# Patient Record
Sex: Female | Born: 1955 | Race: Black or African American | Hispanic: No | Marital: Married | Smoking: Former smoker
Health system: Southern US, Community
[De-identification: ages and names within clinical notes are randomized; demographics above are authoritative.]

## PROBLEM LIST (undated history)

## (undated) DIAGNOSIS — I10 Essential (primary) hypertension: Secondary | ICD-10-CM

## (undated) DIAGNOSIS — E119 Type 2 diabetes mellitus without complications: Secondary | ICD-10-CM

## (undated) HISTORY — PX: CARPAL TUNNEL RELEASE: SHX101

---

## 2016-07-28 DIAGNOSIS — E782 Mixed hyperlipidemia: Secondary | ICD-10-CM | POA: Insufficient documentation

## 2017-09-07 DIAGNOSIS — Z603 Acculturation difficulty: Secondary | ICD-10-CM | POA: Insufficient documentation

## 2018-09-19 DIAGNOSIS — E1142 Type 2 diabetes mellitus with diabetic polyneuropathy: Secondary | ICD-10-CM | POA: Insufficient documentation

## 2018-09-22 DIAGNOSIS — G8929 Other chronic pain: Secondary | ICD-10-CM | POA: Insufficient documentation

## 2019-10-21 ENCOUNTER — Emergency Department (HOSPITAL_BASED_OUTPATIENT_CLINIC_OR_DEPARTMENT_OTHER): Payer: Self-pay

## 2019-10-21 ENCOUNTER — Emergency Department (HOSPITAL_BASED_OUTPATIENT_CLINIC_OR_DEPARTMENT_OTHER)
Admission: EM | Admit: 2019-10-21 | Discharge: 2019-10-21 | Disposition: A | Discharge status: Home / Self Care | Payer: Self-pay | Attending: Emergency Medicine | Admitting: Emergency Medicine

## 2019-10-21 ENCOUNTER — Other Ambulatory Visit: Payer: Self-pay

## 2019-10-21 ENCOUNTER — Encounter (HOSPITAL_BASED_OUTPATIENT_CLINIC_OR_DEPARTMENT_OTHER): Payer: Self-pay | Admitting: Emergency Medicine

## 2019-10-21 DIAGNOSIS — M25531 Pain in right wrist: Secondary | ICD-10-CM | POA: Insufficient documentation

## 2019-10-21 DIAGNOSIS — E119 Type 2 diabetes mellitus without complications: Secondary | ICD-10-CM | POA: Insufficient documentation

## 2019-10-21 DIAGNOSIS — I10 Essential (primary) hypertension: Secondary | ICD-10-CM | POA: Insufficient documentation

## 2019-10-21 HISTORY — DX: Type 2 diabetes mellitus without complications: E11.9

## 2019-10-21 HISTORY — DX: Essential (primary) hypertension: I10

## 2019-10-21 MED ORDER — OXYCODONE-ACETAMINOPHEN 5-325 MG PO TABS
1.0000 | ORAL_TABLET | Freq: Once | ORAL | Status: AC
  Administered 2019-10-21: 1 via ORAL
  Filled 2019-10-21: qty 1

## 2019-10-21 MED ORDER — OXYCODONE-ACETAMINOPHEN 5-325 MG PO TABS: 1.0000 | ORAL_TABLET | Freq: Three times a day (TID) | ORAL | 0 refills | Status: AC | PRN

## 2019-10-21 MED ORDER — NAPROXEN 500 MG PO TABS: 500.0000 mg | ORAL_TABLET | Freq: Two times a day (BID) | ORAL | 0 refills | Status: DC

## 2019-10-21 NOTE — ED Notes (Signed)
Portable Xray @ bedside

## 2019-10-21 NOTE — ED Triage Notes (Signed)
Pt c/o right wrist/hand pain x 1 month. Pt denies recent injury.

## 2019-10-21 NOTE — Discharge Instructions (Signed)
Regular taking the prescribed anti-inflammatory as well as the Percocet for pain control.  Please return to ER for reassessment if you develop any redness, swelling.  Please schedule follow-up appointment with the hand specialist regarding the pain that you are experiencing.  Recommend wearing brace for your wrist as discussed.  Recommend resting, icing and elevating when possible.

## 2019-10-21 NOTE — ED Notes (Signed)
ED Provider at bedside with video interpreter to review d/c instructions

## 2019-10-21 NOTE — ED Notes (Signed)
ED Provider at bedside. 

## 2019-10-22 NOTE — ED Provider Notes (Signed)
[facility] EMERGENCY DEPARTMENT Provider Note   CSN: [id number] Arrival date & time: 10/21/19  1534     History Chief Complaint  Patient presents with  . Hand Pain  . Wrist Pain    Lindsey Hart is a 63 y.o. female.  Presents emerged from chief complaint right hand pain, wrist pain.  Has suffered from pain for the last couple years, has worsened over the past couple months.  Reports that she has seen someone about this previously but not in the past few weeks.  States at work she does a lot of repetitive hand movements, worsened with certain movements.  No significant swelling, no erythema, no recent injuries.  Has tried applying a topical gel, uncertain which this is with minimal relief.  History obtained from daughter, patient via translator, patient [place] only speaking.  HPI     Past Medical History:  Diagnosis Date  . Diabetes mellitus without complication ([place])   . Hypertension     There are no problems to display for this patient.   History reviewed. No pertinent surgical history.   OB History   No obstetric history on file.     No family history on file.  Social History   Tobacco Use  . Smoking status: Never Smoker  . Smokeless tobacco: Never Used  Substance Use Topics  . Alcohol use: Yes    Comment: occ  . Drug use: Never    Home Medications Prior to Admission medications   Medication Sig Start Date End Date Taking? Authorizing Provider  naproxen (NAPROSYN) 500 MG tablet Take 1 tablet (500 mg total) by mouth 2 (two) times daily. 10/21/19   Lucrezia Starch, MD  oxyCODONE-acetaminophen (PERCOCET) 5-325 MG tablet Take 1 tablet by mouth every 8 (eight) hours as needed for up to 3 days for severe pain. 10/21/19 10/24/19  Lucrezia Starch, MD    Allergies    Patient has no known allergies.  Review of Systems   Review of Systems  Constitutional: Negative for chills and fever.  HENT: Negative for ear pain and sore throat.     Eyes: Negative for pain and visual disturbance.  Respiratory: Negative for cough and shortness of breath.   Cardiovascular: Negative for chest pain and palpitations.  Gastrointestinal: Negative for abdominal pain and vomiting.  Genitourinary: Negative for dysuria and hematuria.  Musculoskeletal: Positive for arthralgias. Negative for back pain.  Skin: Negative for color change and rash.  Neurological: Negative for seizures and syncope.  All other systems reviewed and are negative.   Physical Exam Updated Vital Signs BP (!) 174/77 (BP Location: Left Arm)   Pulse 68   Temp 98.9 F (37.2 C)   Resp 18   Ht 5\' 5"  (1.651 m)   Wt 68 kg   SpO2 100%   BMI 24.96 kg/m   Physical Exam Vitals and nursing note reviewed.  Constitutional:      General: She is not in acute distress.    Appearance: She is well-developed.  HENT:     Head: Normocephalic and atraumatic.  Eyes:     Conjunctiva/sclera: Conjunctivae normal.  Cardiovascular:     Rate and Rhythm: Normal rate and regular rhythm.     Heart sounds: No murmur.  Pulmonary:     Effort: Pulmonary effort is normal. No respiratory distress.     Breath sounds: Normal breath sounds.  Abdominal:     Palpations: Abdomen is soft.     Tenderness: There is no abdominal  tenderness.  Musculoskeletal:     Cervical back: Neck supple.     Comments: RUE: There is some tenderness over wrist, proximal hand, no obvious deformity, normal range of motion of wrist, no erythema, no swelling, no ecchymosis, distal sensation intact, normal radial pulse  Skin:    General: Skin is warm and dry.  Neurological:     Mental Status: She is alert.     ED Results / Procedures / Treatments   Labs (all labs ordered are listed, but only abnormal results are displayed) Labs Reviewed - No data to display  EKG None  Radiology DG Forearm Right  Result Date: 10/21/2019 CLINICAL DATA:  Right forearm pain, no known injury, initial encounter EXAM: RIGHT  FOREARM - 2 VIEW COMPARISON:  None. FINDINGS: Mild degenerative changes of the radiocarpal joint are seen. No acute fracture or dislocation is noted. No soft tissue abnormality is noted. IMPRESSION: No acute abnormality noted. Electronically Signed   By: Alcide Clever M.D.   On: 10/21/2019 17:45   DG Hand Complete Right  Result Date: 10/21/2019 CLINICAL DATA:  Hand pain with repetitive work motions, no known injury. EXAM: RIGHT HAND - COMPLETE 3+ VIEW COMPARISON:  None. FINDINGS: No acute fracture or dislocation is noted. Mild interphalangeal degenerative changes noted. No soft tissue abnormality is seen. IMPRESSION: Mild degenerative change without acute abnormality. Electronically Signed   By: Alcide Clever M.D.   On: 10/21/2019 17:44    Procedures Procedures (including critical care time)  Medications Ordered in ED Medications  oxyCODONE-acetaminophen (PERCOCET/ROXICET) 5-325 MG per tablet 1 tablet (1 tablet Oral Given 10/21/19 1716)    ED Course  I have reviewed the triage vital signs and the nursing notes.  Pertinent labs & imaging results that were available during my care of the patient were reviewed by me and considered in my medical decision making (see chart for details).    MDM Rules/Calculators/A&P                      63 year old lady presents to ER with acute on chronic right wrist pain.  On exam no obvious deformity was noted, did have some tenderness over her wrist.  X-rays negative.  Based on symptomatology, suspect arthritis versus carpal tunnel syndrome.  Recommend follow-up with hand surgery.  Recommended trial of NSAIDs, short Rx for Percocet, provided wrist brace and recommended no heavy lifting.  Reviewed return precautions, will discharge home.     After the discussed management above, the patient was determined to be safe for discharge.  The patient was in agreement with this plan and all questions regarding their care were answered.  ED return precautions were  discussed and the patient will return to the ED with any significant worsening of condition.   Final Clinical Impression(s) / ED Diagnoses Final diagnoses:  Right wrist pain    Rx / DC Orders ED Discharge Orders         Ordered    oxyCODONE-acetaminophen (PERCOCET) 5-325 MG tablet  Every 8 hours PRN     10/21/19 1844    naproxen (NAPROSYN) 500 MG tablet  2 times daily     10/21/19 1844           Milagros Loll, MD 10/22/19 [phone]

## 2019-10-26 DIAGNOSIS — M79641 Pain in right hand: Secondary | ICD-10-CM | POA: Insufficient documentation

## 2019-11-12 DIAGNOSIS — G5601 Carpal tunnel syndrome, right upper limb: Secondary | ICD-10-CM | POA: Insufficient documentation

## 2021-07-15 ENCOUNTER — Inpatient Hospital Stay (HOSPITAL_BASED_OUTPATIENT_CLINIC_OR_DEPARTMENT_OTHER)
Admission: EM | Admit: 2021-07-15 | Discharge: 2021-07-17 | DRG: 304 | Disposition: A | Discharge status: Home Health | Payer: Medicare Other | Attending: Internal Medicine | Admitting: Internal Medicine

## 2021-07-15 ENCOUNTER — Other Ambulatory Visit: Payer: Self-pay

## 2021-07-15 ENCOUNTER — Encounter (HOSPITAL_BASED_OUTPATIENT_CLINIC_OR_DEPARTMENT_OTHER): Payer: Self-pay | Admitting: *Deleted

## 2021-07-15 ENCOUNTER — Emergency Department (HOSPITAL_BASED_OUTPATIENT_CLINIC_OR_DEPARTMENT_OTHER): Payer: Medicare Other

## 2021-07-15 DIAGNOSIS — Z791 Long term (current) use of non-steroidal anti-inflammatories (NSAID): Secondary | ICD-10-CM

## 2021-07-15 DIAGNOSIS — Z87891 Personal history of nicotine dependence: Secondary | ICD-10-CM

## 2021-07-15 DIAGNOSIS — E119 Type 2 diabetes mellitus without complications: Secondary | ICD-10-CM

## 2021-07-15 DIAGNOSIS — Z20822 Contact with and (suspected) exposure to covid-19: Secondary | ICD-10-CM | POA: Diagnosis present

## 2021-07-15 DIAGNOSIS — H539 Unspecified visual disturbance: Secondary | ICD-10-CM

## 2021-07-15 DIAGNOSIS — I160 Hypertensive urgency: Principal | ICD-10-CM | POA: Diagnosis present

## 2021-07-15 DIAGNOSIS — G9340 Encephalopathy, unspecified: Secondary | ICD-10-CM

## 2021-07-15 DIAGNOSIS — I10 Essential (primary) hypertension: Secondary | ICD-10-CM

## 2021-07-15 DIAGNOSIS — R519 Headache, unspecified: Secondary | ICD-10-CM | POA: Diagnosis present

## 2021-07-15 DIAGNOSIS — Z794 Long term (current) use of insulin: Secondary | ICD-10-CM

## 2021-07-15 DIAGNOSIS — G9341 Metabolic encephalopathy: Secondary | ICD-10-CM | POA: Diagnosis present

## 2021-07-15 DIAGNOSIS — H532 Diplopia: Secondary | ICD-10-CM | POA: Diagnosis present

## 2021-07-15 DIAGNOSIS — E1165 Type 2 diabetes mellitus with hyperglycemia: Secondary | ICD-10-CM | POA: Diagnosis present

## 2021-07-15 DIAGNOSIS — M62838 Other muscle spasm: Secondary | ICD-10-CM | POA: Diagnosis present

## 2021-07-15 DIAGNOSIS — I959 Hypotension, unspecified: Secondary | ICD-10-CM | POA: Diagnosis present

## 2021-07-15 LAB — DIFFERENTIAL
Abs Immature Granulocytes: 0.01 10*3/uL (ref 0.00–0.07)
Basophils Absolute: 0.1 10*3/uL (ref 0.0–0.1)
Basophils Relative: 1 %
Eosinophils Absolute: 0.1 10*3/uL (ref 0.0–0.5)
Eosinophils Relative: 2 %
Immature Granulocytes: 0 %
Lymphocytes Relative: 44 %
Lymphs Abs: 2.6 10*3/uL (ref 0.7–4.0)
Monocytes Absolute: 0.4 10*3/uL (ref 0.1–1.0)
Monocytes Relative: 7 %
Neutro Abs: 2.7 10*3/uL (ref 1.7–7.7)
Neutrophils Relative %: 46 %

## 2021-07-15 LAB — COMPREHENSIVE METABOLIC PANEL
ALT: 33 U/L (ref 0–44)
AST: 22 U/L (ref 15–41)
Albumin: 3.9 g/dL (ref 3.5–5.0)
Alkaline Phosphatase: 73 U/L (ref 38–126)
Anion gap: 7 (ref 5–15)
BUN: 13 mg/dL (ref 8–23)
CO2: 27 mmol/L (ref 22–32)
Calcium: 9.5 mg/dL (ref 8.9–10.3)
Chloride: 103 mmol/L (ref 98–111)
Creatinine, Ser: 0.73 mg/dL (ref 0.44–1.00)
GFR, Estimated: >60 mL/min (ref >60)
Glucose, Bld: 148 mg/dL — ABNORMAL HIGH (ref 70–99)
Potassium: 3.9 mmol/L (ref 3.5–5.1)
Sodium: 137 mmol/L (ref 135–145)
Total Bilirubin: 0.6 mg/dL (ref 0.3–1.2)
Total Protein: 7.4 g/dL (ref 6.5–8.1)

## 2021-07-15 LAB — CBC
HCT: 39.5 % (ref 36.0–46.0)
Hemoglobin: 12.7 g/dL (ref 12.0–15.0)
MCH: 27.5 pg (ref 26.0–34.0)
MCHC: 32.2 g/dL (ref 30.0–36.0)
MCV: 85.7 fL (ref 80.0–100.0)
Platelets: 280 10*3/uL (ref 150–400)
RBC: 4.61 MIL/uL (ref 3.87–5.11)
RDW: 13.6 % (ref 11.5–15.5)
WBC: 5.9 10*3/uL (ref 4.0–10.5)
nRBC: 0 % (ref 0.0–0.2)

## 2021-07-15 LAB — APTT: aPTT: 27 seconds (ref 24–36)

## 2021-07-15 LAB — TROPONIN I (HIGH SENSITIVITY)
Troponin I (High Sensitivity): 6 ng/L (ref <18)
Troponin I (High Sensitivity): 6 ng/L (ref <18)

## 2021-07-15 LAB — CBG MONITORING, ED: Glucose-Capillary: 148 mg/dL — ABNORMAL HIGH (ref 70–99)

## 2021-07-15 LAB — PROTIME-INR
INR: 0.9 (ref 0.8–1.2)
Prothrombin Time: 12.1 seconds (ref 11.4–15.2)

## 2021-07-15 LAB — RESP PANEL BY RT-PCR (FLU A&B, COVID) ARPGX2
Influenza A by PCR: NEGATIVE
Influenza B by PCR: NEGATIVE
SARS Coronavirus 2 by RT PCR: NEGATIVE

## 2021-07-15 LAB — URINALYSIS, ROUTINE W REFLEX MICROSCOPIC
Bilirubin Urine: NEGATIVE
Glucose, UA: NEGATIVE mg/dL
Hgb urine dipstick: NEGATIVE
Ketones, ur: NEGATIVE mg/dL
Leukocytes,Ua: NEGATIVE
Nitrite: NEGATIVE
Protein, ur: NEGATIVE mg/dL
Specific Gravity, Urine: 1.02 (ref 1.005–1.030)
pH: 7 (ref 5.0–8.0)

## 2021-07-15 LAB — RAPID URINE DRUG SCREEN, HOSP PERFORMED
Amphetamines: NOT DETECTED
Barbiturates: NOT DETECTED
Benzodiazepines: NOT DETECTED
Cocaine: NOT DETECTED
Opiates: NOT DETECTED
Tetrahydrocannabinol: NOT DETECTED

## 2021-07-15 LAB — ETHANOL: Alcohol, Ethyl (B): <10 mg/dL (ref <10)

## 2021-07-15 MED ORDER — LABETALOL HCL 5 MG/ML IV SOLN
5.0000 mg | Freq: Once | INTRAVENOUS | Status: AC
  Administered 2021-07-15: 5 mg via INTRAVENOUS
  Filled 2021-07-15: qty 4

## 2021-07-15 MED ORDER — ONDANSETRON HCL 4 MG/2ML IJ SOLN
4.0000 mg | Freq: Once | INTRAMUSCULAR | Status: AC
  Administered 2021-07-15: 4 mg via INTRAVENOUS
  Filled 2021-07-15: qty 2

## 2021-07-15 NOTE — ED Notes (Signed)
Checked CBG 148, RN Katelyn informed

## 2021-07-15 NOTE — ED Triage Notes (Signed)
Per pts daughter, pt with sudden onset double vision 6 days ago while driving.  Reports that CBGs have been in the 400s.  HTN at home-at PCP today it was 214 systolic.  Reports AMS and confusion x 6 days as well.  Denies change in speech.  No facial droop, no arm or leg drift noted.

## 2021-07-15 NOTE — ED Notes (Signed)
Patient transported to CT 

## 2021-07-15 NOTE — ED Provider Notes (Signed)
Emergency Department Provider Note   I have reviewed the triage vital signs and the nursing notes.   HISTORY  Chief Complaint Headache   HPI Lindsey Hart is a 65 y.o. female with PMH of DM and HTN presents to the emergency department with confusion, vision change, headache for the past 6 days.  The patient's daughter is at bedside and provides most of the history.  She tells me that the patient is typically up and active.  She is mentally sharp.  She was driving when she suddenly developed double vision and felt decreased vision sensation in the right eye.  She is not having eye pain or redness.  No excessive tearing.  No injury.  She is developed a dull, constant headache without unilateral weakness or numbness.  No speech disturbance.  The patient's daughter got eyedrops over-the-counter and began adjusting the patient's insulin noting that she was running very high blood sugars often in the 400s.  She has been managing over the past 6 days at home but ultimately saw the PCP today who sent her here for stroke evaluation.  The patient's daughter states that in addition to the vision change and headache she seemed confused.  She has been cooking at home but leaving the stove on and generally seeming confused at times. No history of similar in the past.   Level 5 caveat: AMS   Past Medical History:  Diagnosis Date   Diabetes mellitus without complication (HCC)    Hypertension     Patient Active Problem List   Diagnosis Date Noted   Acute encephalopathy 07/16/2021   Vision disturbance 07/16/2021   Insulin-requiring or dependent type II diabetes mellitus (HCC) 07/16/2021   Primary hypertension    Hypertensive urgency 07/15/2021    Past Surgical History:  Procedure Laterality Date   CARPAL TUNNEL RELEASE      Allergies Patient has no known allergies.  Family History  Problem Relation Age of Onset   Gallbladder disease Mother     Social History Social History    Tobacco Use   Smoking status: Former    Types: Cigarettes   Smokeless tobacco: Never  Vaping Use   Vaping Use: Never used  Substance Use Topics   Alcohol use: Yes    Comment: occasional wine   Drug use: Never    Review of Systems  Constitutional: No fever/chills Eyes: Positive decreased vision in the right eye and double vision.  ENT: No sore throat. Cardiovascular: Denies chest pain. Respiratory: Denies shortness of breath. Gastrointestinal: No abdominal pain.  No nausea, no vomiting.  No diarrhea.  No constipation. Genitourinary: Negative for dysuria. Musculoskeletal: Negative for back pain. Skin: Negative for rash. Neurological: Negative for focal weakness or numbness. Positive HA and confusion.   10-point ROS otherwise negative.  ____________________________________________   PHYSICAL EXAM:  VITAL SIGNS: ED Triage Vitals  Enc Vitals Group     BP 07/15/21 2007 (!) 200/96     Pulse Rate 07/15/21 2007 81     Resp 07/15/21 2007 20     Temp 07/15/21 2007 98.4 F (36.9 C)     Temp Source 07/15/21 2007 Oral     SpO2 07/15/21 2007 98 %     Weight 07/15/21 2008 140 lb (63.5 kg)     Height 07/15/21 2008 5\' 2"  (1.575 m)   Constitutional: Alert and oriented. Well appearing and in no acute distress. Eyes: Conjunctivae are normal. PERRL (3 mm) EOMI. Patient light/dark vision on the right with preserved  vision on the left. No photophobia.  Head: Atraumatic. Nose: No congestion/rhinnorhea. Mouth/Throat: Mucous membranes are moist.  Oropharynx non-erythematous. Neck: No stridor.   Cardiovascular: Normal rate, regular rhythm. Good peripheral circulation. Grossly normal heart sounds.   Respiratory: Normal respiratory effort.  No retractions. Lungs CTAB. Gastrointestinal: Soft and nontender. No distention.  Musculoskeletal: No lower extremity tenderness nor edema. No gross deformities of extremities. Neurologic:  Normal speech and language. No facial asymmetry. 5/5 strength  in the bilateral upper/lower extremities.  Skin:  Skin is warm, dry and intact. No rash noted.   ____________________________________________   LABS (all labs ordered are listed, but only abnormal results are displayed)  Labs Reviewed  COMPREHENSIVE METABOLIC PANEL - Abnormal; Notable for the following components:      Result Value   Glucose, Bld 148 (*)    All other components within normal limits  HEMOGLOBIN A1C - Abnormal; Notable for the following components:   Hgb A1c MFr Bld 11.5 (*)    All other components within normal limits  LIPID PANEL - Abnormal; Notable for the following components:   Cholesterol 226 (*)    LDL Cholesterol 135 (*)    All other components within normal limits  CBC - Abnormal; Notable for the following components:   Hemoglobin 11.8 (*)    All other components within normal limits  COMPREHENSIVE METABOLIC PANEL - Abnormal; Notable for the following components:   Glucose, Bld 160 (*)    Total Protein 6.2 (*)    Albumin 3.2 (*)    All other components within normal limits  GLUCOSE, CAPILLARY - Abnormal; Notable for the following components:   Glucose-Capillary 114 (*)    All other components within normal limits  GLUCOSE, CAPILLARY - Abnormal; Notable for the following components:   Glucose-Capillary 351 (*)    All other components within normal limits  BASIC METABOLIC PANEL - Abnormal; Notable for the following components:   Glucose, Bld 173 (*)    All other components within normal limits  GLUCOSE, CAPILLARY - Abnormal; Notable for the following components:   Glucose-Capillary 144 (*)    All other components within normal limits  GLUCOSE, CAPILLARY - Abnormal; Notable for the following components:   Glucose-Capillary 162 (*)    All other components within normal limits  GLUCOSE, CAPILLARY - Abnormal; Notable for the following components:   Glucose-Capillary 186 (*)    All other components within normal limits  GLUCOSE, CAPILLARY - Abnormal;  Notable for the following components:   Glucose-Capillary 181 (*)    All other components within normal limits  CBG MONITORING, ED - Abnormal; Notable for the following components:   Glucose-Capillary 148 (*)    All other components within normal limits  RESP PANEL BY RT-PCR (FLU A&B, COVID) ARPGX2  ETHANOL  PROTIME-INR  APTT  CBC  DIFFERENTIAL  RAPID URINE DRUG SCREEN, HOSP PERFORMED  URINALYSIS, ROUTINE W REFLEX MICROSCOPIC  HIV ANTIBODY (ROUTINE TESTING W REFLEX)  TSH  AMMONIA  VITAMIN B12  RPR  GLUCOSE, CAPILLARY  CBC  MAGNESIUM  TROPONIN I (HIGH SENSITIVITY)  TROPONIN I (HIGH SENSITIVITY)   ____________________________________________  EKG   EKG Interpretation  Date/Time:  Wednesday July 15 2021 20:08:52 EDT Ventricular Rate:  77 PR Interval:  148 QRS Duration: 95 QT Interval:  354 QTC Calculation: 401 R Axis:   71 Text Interpretation: Sinus rhythm Abnormal T, consider ischemia, diffuse leads Minimal ST elevation, anterior leads No old tracing to compare Confirmed by Alona Bene [phone]) on 07/15/2021 8:10:52 PM  ____________________________________________  RADIOLOGY  No results found.  ____________________________________________   PROCEDURES  Procedure(s) performed:   Procedures   ____________________________________________   INITIAL IMPRESSION / ASSESSMENT AND PLAN / ED COURSE  Pertinent labs & imaging results that were available during my care of the patient were reviewed by me and considered in my medical decision making (see chart for details).   Patient presents the emergency department 6 days of altered mental status, headache, unilateral vision change, confusion.  She is afebrile here but has very high blood pressure on my evaluation with systolic pressures in the 230 range.  She is not having eye pain, conjunctival injection, photophobia, tearing to strongly suspect acute angle glaucoma. Differential includes CRAO and  retinal detachment but that does not explain the patient's confusion and HA. HTN emergency is a consideration. ICH considered as well. Doubt infectious etiology. Plan for CT head, labs, and neuro evaluation.   CT head and labs reviewed. No acute findings. Patient with elevated BP, question HTN emergency. Will admit for BP mgmt and MRI. Consulted teleneruology for recommendations.   Discussed patient's case with [facility] to request admission. Patient and family (if present) updated with plan. Care transferred to [facility] service.  I reviewed all nursing notes, vitals, pertinent old records, EKGs, labs, imaging (as available).  ____________________________________________  FINAL CLINICAL IMPRESSION(S) / ED DIAGNOSES  Final diagnoses:  Diplopia  Primary hypertension  Acute nonintractable headache, unspecified headache type     MEDICATIONS GIVEN DURING THIS VISIT:  Medications  labetalol (NORMODYNE) injection 5 mg (5 mg Intravenous Given 07/15/21 2030)  ondansetron (ZOFRAN) injection 4 mg (4 mg Intravenous Given 07/15/21 2058)   stroke: mapping our early stages of recovery book ( Does not apply Given 07/16/21 0729)    Note:  This document was prepared using Dragon voice recognition software and may include unintentional dictation errors.  Alona Bene, MD, [facility] Emergency Medicine    Jaxyn Mestas, Arlyss Repress, MD 07/19/21 1106

## 2021-07-16 ENCOUNTER — Inpatient Hospital Stay (HOSPITAL_COMMUNITY): Payer: Medicare Other

## 2021-07-16 ENCOUNTER — Encounter (HOSPITAL_COMMUNITY): Payer: Self-pay | Admitting: Family Medicine

## 2021-07-16 DIAGNOSIS — M62838 Other muscle spasm: Secondary | ICD-10-CM | POA: Diagnosis present

## 2021-07-16 DIAGNOSIS — E119 Type 2 diabetes mellitus without complications: Secondary | ICD-10-CM | POA: Diagnosis not present

## 2021-07-16 DIAGNOSIS — R410 Disorientation, unspecified: Secondary | ICD-10-CM

## 2021-07-16 DIAGNOSIS — Z794 Long term (current) use of insulin: Secondary | ICD-10-CM | POA: Diagnosis not present

## 2021-07-16 DIAGNOSIS — Z87891 Personal history of nicotine dependence: Secondary | ICD-10-CM | POA: Diagnosis not present

## 2021-07-16 DIAGNOSIS — R519 Headache, unspecified: Secondary | ICD-10-CM | POA: Diagnosis present

## 2021-07-16 DIAGNOSIS — E1165 Type 2 diabetes mellitus with hyperglycemia: Secondary | ICD-10-CM | POA: Diagnosis present

## 2021-07-16 DIAGNOSIS — H539 Unspecified visual disturbance: Secondary | ICD-10-CM | POA: Diagnosis not present

## 2021-07-16 DIAGNOSIS — Z20822 Contact with and (suspected) exposure to covid-19: Secondary | ICD-10-CM | POA: Diagnosis present

## 2021-07-16 DIAGNOSIS — G9341 Metabolic encephalopathy: Secondary | ICD-10-CM | POA: Diagnosis present

## 2021-07-16 DIAGNOSIS — I10 Essential (primary) hypertension: Secondary | ICD-10-CM | POA: Diagnosis present

## 2021-07-16 DIAGNOSIS — G9340 Encephalopathy, unspecified: Secondary | ICD-10-CM | POA: Diagnosis present

## 2021-07-16 DIAGNOSIS — H532 Diplopia: Secondary | ICD-10-CM | POA: Diagnosis present

## 2021-07-16 DIAGNOSIS — I160 Hypertensive urgency: Secondary | ICD-10-CM | POA: Diagnosis present

## 2021-07-16 DIAGNOSIS — Z791 Long term (current) use of non-steroidal anti-inflammatories (NSAID): Secondary | ICD-10-CM | POA: Diagnosis not present

## 2021-07-16 DIAGNOSIS — I959 Hypotension, unspecified: Secondary | ICD-10-CM | POA: Diagnosis present

## 2021-07-16 LAB — COMPREHENSIVE METABOLIC PANEL
ALT: 30 U/L (ref 0–44)
AST: 23 U/L (ref 15–41)
Albumin: 3.2 g/dL — ABNORMAL LOW (ref 3.5–5.0)
Alkaline Phosphatase: 60 U/L (ref 38–126)
Anion gap: 5 (ref 5–15)
BUN: 15 mg/dL (ref 8–23)
CO2: 25 mmol/L (ref 22–32)
Calcium: 9.4 mg/dL (ref 8.9–10.3)
Chloride: 108 mmol/L (ref 98–111)
Creatinine, Ser: 0.71 mg/dL (ref 0.44–1.00)
GFR, Estimated: >60 mL/min (ref >60)
Glucose, Bld: 160 mg/dL — ABNORMAL HIGH (ref 70–99)
Potassium: 4.2 mmol/L (ref 3.5–5.1)
Sodium: 138 mmol/L (ref 135–145)
Total Bilirubin: 0.4 mg/dL (ref 0.3–1.2)
Total Protein: 6.2 g/dL — ABNORMAL LOW (ref 6.5–8.1)

## 2021-07-16 LAB — CBC
HCT: 37.7 % (ref 36.0–46.0)
Hemoglobin: 11.8 g/dL — ABNORMAL LOW (ref 12.0–15.0)
MCH: 27.3 pg (ref 26.0–34.0)
MCHC: 31.3 g/dL (ref 30.0–36.0)
MCV: 87.3 fL (ref 80.0–100.0)
Platelets: 272 10*3/uL (ref 150–400)
RBC: 4.32 MIL/uL (ref 3.87–5.11)
RDW: 13.7 % (ref 11.5–15.5)
WBC: 4.9 10*3/uL (ref 4.0–10.5)
nRBC: 0 % (ref 0.0–0.2)

## 2021-07-16 LAB — HEMOGLOBIN A1C
Hgb A1c MFr Bld: 11.5 % — ABNORMAL HIGH (ref 4.8–5.6)
Mean Plasma Glucose: 283.35 mg/dL

## 2021-07-16 LAB — LIPID PANEL
Cholesterol: 226 mg/dL — ABNORMAL HIGH (ref 0–200)
HDL: 77 mg/dL (ref >40)
LDL Cholesterol: 135 mg/dL — ABNORMAL HIGH (ref 0–99)
Total CHOL/HDL Ratio: 2.9 RATIO
Triglycerides: 70 mg/dL (ref <150)
VLDL: 14 mg/dL (ref 0–40)

## 2021-07-16 LAB — GLUCOSE, CAPILLARY
Glucose-Capillary: 114 mg/dL — ABNORMAL HIGH (ref 70–99)
Glucose-Capillary: 351 mg/dL — ABNORMAL HIGH (ref 70–99)
Glucose-Capillary: 76 mg/dL (ref 70–99)
Glucose-Capillary: 144 mg/dL — ABNORMAL HIGH (ref 70–99)
Glucose-Capillary: 162 mg/dL — ABNORMAL HIGH (ref 70–99)

## 2021-07-16 LAB — AMMONIA: Ammonia: 28 umol/L (ref 9–35)

## 2021-07-16 LAB — RPR: RPR Ser Ql: NONREACTIVE

## 2021-07-16 LAB — VITAMIN B12: Vitamin B-12: 490 pg/mL (ref 180–914)

## 2021-07-16 LAB — TSH: TSH: 2.997 u[IU]/mL (ref 0.350–4.500)

## 2021-07-16 LAB — HIV ANTIBODY (ROUTINE TESTING W REFLEX): HIV Screen 4th Generation wRfx: NONREACTIVE

## 2021-07-16 MED ORDER — STROKE: EARLY STAGES OF RECOVERY BOOK
Freq: Once | Status: AC
  Filled 2021-07-16: qty 1

## 2021-07-16 MED ORDER — AMLODIPINE BESYLATE 10 MG PO TABS
10.0000 mg | ORAL_TABLET | Freq: Every day | ORAL | Status: DC
  Administered 2021-07-16 – 2021-07-17 (×2): 10 mg via ORAL
  Filled 2021-07-16 (×2): qty 1

## 2021-07-16 MED ORDER — ACETAMINOPHEN 650 MG RE SUPP: 650.0000 mg | Freq: Four times a day (QID) | RECTAL | Status: DC | PRN

## 2021-07-16 MED ORDER — LIVING WELL WITH DIABETES BOOK
Freq: Once | Status: DC
  Filled 2021-07-16: qty 1

## 2021-07-16 MED ORDER — INSULIN ASPART 100 UNIT/ML IJ SOLN
0.0000 [IU] | Freq: Every day | INTRAMUSCULAR | Status: DC
Start: 2021-07-16 — End: 2021-07-17

## 2021-07-16 MED ORDER — ENOXAPARIN SODIUM 40 MG/0.4ML IJ SOSY
40.0000 mg | PREFILLED_SYRINGE | Freq: Every day | INTRAMUSCULAR | Status: DC
  Administered 2021-07-16 – 2021-07-17 (×2): 40 mg via SUBCUTANEOUS
  Filled 2021-07-16 (×2): qty 0.4

## 2021-07-16 MED ORDER — LORAZEPAM 2 MG/ML IJ SOLN: 0.5000 mg | Freq: Once | INTRAMUSCULAR | Status: DC | PRN

## 2021-07-16 MED ORDER — ACETAMINOPHEN 325 MG PO TABS
650.0000 mg | ORAL_TABLET | Freq: Four times a day (QID) | ORAL | Status: DC | PRN
  Administered 2021-07-16 – 2021-07-17 (×2): 650 mg via ORAL
  Filled 2021-07-16 (×2): qty 2

## 2021-07-16 MED ORDER — SENNOSIDES-DOCUSATE SODIUM 8.6-50 MG PO TABS: 1.0000 | ORAL_TABLET | Freq: Every evening | ORAL | Status: DC | PRN

## 2021-07-16 MED ORDER — KETOROLAC TROMETHAMINE 15 MG/ML IJ SOLN
15.0000 mg | Freq: Four times a day (QID) | INTRAMUSCULAR | Status: DC | PRN
  Administered 2021-07-16: 15 mg via INTRAVENOUS
  Filled 2021-07-16: qty 1

## 2021-07-16 MED ORDER — INSULIN GLARGINE-YFGN 100 UNIT/ML ~~LOC~~ SOLN
15.0000 [IU] | Freq: Every day | SUBCUTANEOUS | Status: DC
  Administered 2021-07-16: 15 [IU] via SUBCUTANEOUS
  Filled 2021-07-16 (×2): qty 0.15

## 2021-07-16 MED ORDER — INSULIN ASPART 100 UNIT/ML IJ SOLN: 3.0000 [IU] | Freq: Three times a day (TID) | INTRAMUSCULAR | Status: DC

## 2021-07-16 MED ORDER — OXYCODONE HCL 5 MG PO TABS: 5.0000 mg | ORAL_TABLET | ORAL | Status: DC | PRN

## 2021-07-16 MED ORDER — ONDANSETRON HCL 4 MG PO TABS: 4.0000 mg | ORAL_TABLET | Freq: Four times a day (QID) | ORAL | Status: DC | PRN

## 2021-07-16 MED ORDER — LABETALOL HCL 5 MG/ML IV SOLN: 10.0000 mg | INTRAVENOUS | Status: DC | PRN

## 2021-07-16 MED ORDER — ONDANSETRON HCL 4 MG/2ML IJ SOLN: 4.0000 mg | Freq: Four times a day (QID) | INTRAMUSCULAR | Status: DC | PRN

## 2021-07-16 MED ORDER — INSULIN ASPART 100 UNIT/ML IJ SOLN
0.0000 [IU] | Freq: Three times a day (TID) | INTRAMUSCULAR | Status: DC
  Administered 2021-07-16: 9 [IU] via SUBCUTANEOUS
  Administered 2021-07-17: 2 [IU] via SUBCUTANEOUS

## 2021-07-16 NOTE — Progress Notes (Signed)
NIH Stroke Scale   Interval: admission to floor Time: 3:20 AM Person Administering Scale: Rose Fillers  LSW 6 days ago  Ms. Lafrenche's daughter was in the room and translated for the exam. Patient has double vision when both eyes are open. When looking out of the right eye, she has trouble identifying objects but can differentiate between light and dark.   1a  Level of consciousness: 0=alert; keenly responsive  1b. LOC questions:  0=Performs both tasks correctly  1c. LOC commands: 0=Performs both tasks correctly  2.  Best Gaze: 0=normal  3.  Visual: 1=Partial hemianopia  4. Facial Palsy: 0=Normal symmetric movement  5a.  Motor left arm: 0=No drift, limb holds 90 (or 45) degrees for full 10 seconds  5b.  Motor right arm: 0=No drift, limb holds 90 (or 45) degrees for full 10 seconds  6a. motor left leg: 1=Drift, limb holds 90 (or 45) degrees but drifts down before full 10 seconds: does not hit bed  6b  Motor right leg:  0=No drift, limb holds 90 (or 45) degrees for full 10 seconds  7. Limb Ataxia: 0=Absent  8.  Sensory: 1=Mild to moderate sensory loss to LLE; patient feels pinprick is less sharp or is dull on the affected side; there is a loss of superficial pain with pinprick but patient is aware She is being touched  9. Best Language:  0=No aphasia, normal  10. Dysarthria: 0=Normal  11. Extinction and Inattention: 0=No abnormality  12. Distal motor function: 0=Normal   Total:   3

## 2021-07-16 NOTE — Progress Notes (Signed)
EEG Completed; Results Pending  

## 2021-07-16 NOTE — Procedures (Signed)
Patient Name: Lindsey Hart  MRN: [id number]  Epilepsy Attending: Charlsie Quest  Referring Physician/Provider: Dr Odie Sera Date: 07/16/2021 Duration: 22.55 mins  Patient history: 81 old female presented with confusion and blurred vision.  EEG evaluate for seizures.  Level of alertness: Awake  AEDs during EEG study: None  Technical aspects: This EEG study was done with scalp electrodes positioned according to the 10-20 International system of electrode placement. Electrical activity was acquired at a sampling rate of 500Hz  and reviewed with a high frequency filter of 70Hz  and a low frequency filter of 1Hz . EEG data were recorded continuously and digitally stored.   Description: The posterior dominant rhythm consists of 8-9 Hz activity of moderate voltage (25-35 uV) seen predominantly in posterior head regions, symmetric and reactive to eye opening and eye closing. Hyperventilation and photic stimulation were not performed.     IMPRESSION: This study is within normal limits. No seizures or epileptiform discharges were seen throughout the recording.  [name] 

## 2021-07-16 NOTE — H&P (Addendum)
History and Physical    Lindsey MarinMarie F Bown GUY:[id number] DOB: 06/04/1956 DOA: 07/15/2021  PCP: [facility]   Patient coming from: Home   Chief Complaint: Blurred vision, confusion   HPI: Lindsey Hart is a 65 y.o. female with medical history significant for hypertension and poorly controlled diabetes mellitus who presents emergency department with 6 days of blurred vision and confusion.  Patient had a experience pain near the right flank, was diagnosed with muscle spasm, started on Robaxin with relief of those symptoms, but then went on to develop acute onset of blurred vision while driving, forcing her to pull over.  She reports some mild baseline vision deficit but now has marked worsening in the right eye.  When she covers her left eye, visual acuity is extremely poor.  Her daughter has also noticed mild confusion and the patient for the past 6 days, noting that she has forgotten to turn the stove off and has had short-term memory loss which is uncharacteristic for her.  Patient was not experiencing any headache until today when she developed a moderate frontal headache.  There has not been any fall, trauma, fever, or neck stiffness.  No focal numbness or weakness.  Patient had never experienced these symptoms previously. At baseline, she drives, is independent in all ADLs, and walks without any device. She does not recall any history of CNS infection.   [facility] Course: Upon arrival to the ED, patient is found to be afebrile, saturating well on room air, and hypertensive as high as 200/100.  EKG features sinus rhythm with diffuse T wave abnormality.  Head CT negative for acute findings but notable for scattered bilateral subcortical white matter calcifications.  Chemistry panel with glucose 148 and CBC unremarkable.  Troponin normal x2.  UDS and UA are unremarkable.  Ethanol is undetectable and COVID and influenza PCR negative.  The patient was given labetalol and Zofran in the ED.   Teleneurology was consulted by the ED physician and the patient was accepted [facility].  Patient was transported prior to evaluation by teleneurologist.  Review of Systems:  All other systems reviewed and apart from HPI, are negative.  Past Medical History:  Diagnosis Date   Diabetes mellitus without complication (HCC)    Hypertension     Past Surgical History:  Procedure Laterality Date   CARPAL TUNNEL RELEASE      Social History:   reports that she has quit smoking. Her smoking use included cigarettes. She has never used smokeless tobacco. She reports current alcohol use. She reports that she does not use drugs.  No Known Allergies  Family History  Problem Relation Age of Onset   Gallbladder disease Mother      Prior to Admission medications   Medication Sig Start Date End Date Taking? Authorizing Provider  insulin aspart (NOVOLOG) 100 UNIT/ML injection Inject into the skin 3 (three) times daily before meals.   Yes [provider]  naproxen (NAPROSYN) 500 MG tablet Take 1 tablet (500 mg total) by mouth 2 (two) times daily. 10/21/19   Milagros Lollykstra, Richard S, MD    Physical Exam: Vitals:   07/15/21 2200 07/15/21 2300 07/16/21 0012 07/16/21 0315  BP: (!) 165/66 (!) 165/62 (!) 177/67   Pulse: 76 79 76   Resp: 20 20 20 20   Temp: 97.9 F (36.6 C)  98.8 F (37.1 C)   TempSrc: Oral  Oral   SpO2: 97% 100% 97%   Weight:   62.3  kg   Height:   5\' 2"  (1.575 m)     Constitutional: NAD, calm  Eyes: PERTLA, lids and conjunctivae normal ENMT: Mucous membranes are moist. Posterior pharynx clear of any exudate or lesions.   Neck: supple, no masses  Respiratory: clear to auscultation bilaterally, no wheezing, no crackles.   Cardiovascular: S1 & S2 heard, regular rate and rhythm. No extremity edema.   Abdomen: No distension, no tenderness, soft. Bowel sounds active.  Musculoskeletal: no clubbing / cyanosis. No joint deformity upper and lower  extremities.   Skin: no significant rashes, lesions, ulcers. Warm, dry, well-perfused. Neurologic: Marked visual acuity deficit involving right eye, CN II-XII grossly intact otherwise. Sensation intact. No drift or neglect. Strength 5/5 in all 4 limbs.  Psychiatric: Alert and oriented to person, place, and situation. Pleasant and cooperative.    Labs and Imaging on Admission: I have personally reviewed following labs and imaging studies  CBC: Recent Labs  Lab 07/15/21 2010 07/16/21 0254  WBC 5.9 4.9  NEUTROABS 2.7  --   HGB 12.7 11.8*  HCT 39.5 37.7  MCV 85.7 87.3  PLT 280 272   Basic Metabolic Panel: Recent Labs  Lab 07/15/21 2010 07/16/21 0254  NA 137 138  K 3.9 4.2  CL 103 108  CO2 27 25  GLUCOSE 148* 160*  BUN 13 15  CREATININE 0.73 0.71  CALCIUM 9.5 9.4   GFR: Estimated Creatinine Clearance: 60.9 mL/min (by C-G formula based on SCr of 0.71 mg/dL). Liver Function Tests: Recent Labs  Lab 07/15/21 2010 07/16/21 0254  AST 22 23  ALT 33 30  ALKPHOS 73 60  BILITOT 0.6 0.4  PROT 7.4 6.2*  ALBUMIN 3.9 3.2*   No results for input(s): LIPASE, AMYLASE in the last 168 hours. Recent Labs  Lab 07/16/21 0254  AMMONIA 28   Coagulation Profile: Recent Labs  Lab 07/15/21 2010  INR 0.9   Cardiac Enzymes: No results for input(s): CKTOTAL, CKMB, CKMBINDEX, TROPONINI in the last 168 hours. BNP (last 3 results) No results for input(s): PROBNP in the last 8760 hours. HbA1C: Recent Labs    07/16/21 0254  HGBA1C 11.5*   CBG: Recent Labs  Lab 07/15/21 2002  GLUCAP 148*   Lipid Profile: Recent Labs    07/16/21 0254  CHOL 226*  HDL 77  LDLCALC 135*  TRIG 70  CHOLHDL 2.9   Thyroid Function Tests: Recent Labs    07/16/21 0254  TSH 2.997   Anemia Panel: Recent Labs    07/16/21 0254  VITAMINB12 490   Urine analysis:    Component Value Date/Time   COLORURINE YELLOW 07/15/2021 2034   APPEARANCEUR CLEAR 07/15/2021 2034   LABSPEC 1.020 07/15/2021  2034   PHURINE 7.0 07/15/2021 2034   GLUCOSEU NEGATIVE 07/15/2021 2034   HGBUR NEGATIVE 07/15/2021 2034   BILIRUBINUR NEGATIVE 07/15/2021 2034   KETONESUR NEGATIVE 07/15/2021 2034   PROTEINUR NEGATIVE 07/15/2021 2034   NITRITE NEGATIVE 07/15/2021 2034   LEUKOCYTESUR NEGATIVE 07/15/2021 2034   Sepsis Labs: @LABRCNTIP (procalcitonin:4,lacticidven:4) ) Recent Results (from the past 240 hour(s))  Resp Panel by RT-PCR (Flu A&B, Covid) Nasopharyngeal Swab     Status: None   Collection Time: 07/15/21  8:34 PM   Specimen: Nasopharyngeal Swab; Nasopharyngeal(NP) swabs in vial transport medium  Result Value Ref Range Status   SARS Coronavirus 2 by RT PCR NEGATIVE NEGATIVE Final    Comment: (NOTE) SARS-CoV-2 target nucleic acids are NOT DETECTED.  The SARS-CoV-2 RNA is generally detectable in upper respiratory specimens  during the acute phase of infection. The lowest concentration of SARS-CoV-2 viral copies this assay can detect is 138 copies/mL. A negative result does not preclude SARS-Cov-2 infection and should not be used as the sole basis for treatment or other patient management decisions. A negative result may occur with  improper specimen collection/handling, submission of specimen other than nasopharyngeal swab, presence of viral mutation(s) within the areas targeted by this assay, and inadequate number of viral copies(<138 copies/mL). A negative result must be combined with clinical observations, patient history, and epidemiological information. The expected result is Negative.  Fact Sheet for Patients:  BloggerCourse.com  Fact Sheet for Healthcare Providers:  SeriousBroker.it  This test is no t yet approved or cleared by the [place] FDA and  has been authorized for detection and/or diagnosis of SARS-CoV-2 by FDA under an Emergency Use Authorization (EUA). This EUA will remain  in effect (meaning this test can be used)  for the duration of the COVID-19 declaration under Section 564(b)(1) of the Act, 21 U.S.C.section 360bbb-3(b)(1), unless the authorization is terminated  or revoked sooner.       Influenza A by PCR NEGATIVE NEGATIVE Final   Influenza B by PCR NEGATIVE NEGATIVE Final    Comment: (NOTE) The Xpert Xpress SARS-CoV-2/FLU/RSV plus assay is intended as an aid in the diagnosis of influenza from Nasopharyngeal swab specimens and should not be used as a sole basis for treatment. Nasal washings and aspirates are unacceptable for Xpert Xpress SARS-CoV-2/FLU/RSV testing.  Fact Sheet for Patients: BloggerCourse.com  Fact Sheet for Healthcare Providers: SeriousBroker.it  This test is not yet approved or cleared by the [place] FDA and has been authorized for detection and/or diagnosis of SARS-CoV-2 by FDA under an Emergency Use Authorization (EUA). This EUA will remain in effect (meaning this test can be used) for the duration of the COVID-19 declaration under Section 564(b)(1) of the Act, 21 U.S.C. section 360bbb-3(b)(1), unless the authorization is terminated or revoked.  Performed at [facility], [address] [place], [place] [address]      Radiological Exams on Admission: CT HEAD WO CONTRAST  Result Date: 07/15/2021 CLINICAL DATA:  Altered mental status and confusion x6 days. Headache. EXAM: CT HEAD WITHOUT CONTRAST TECHNIQUE: Contiguous axial images were obtained from the base of the skull through the vertex without intravenous contrast. COMPARISON:  Report from CT October 14, 2006, however no comparison imaging available at time dictation. FINDINGS: Brain: Scattered bilateral subcortical white matter calcifications are present, which per report were present on prior CT, and are likely sequela of prior infection/inflammation. No evidence of acute large vascular territory infarction, hemorrhage, hydrocephalus, extra-axial  collection or mass lesion/mass effect. Vascular: No hyperdense vessel. Atherosclerotic calcifications of the internal carotid arteries at the skull base. Skull: Normal. Negative for fracture or focal lesion. Sinuses/Orbits: Visualized portions of the paranasal sinuses and mastoid air cells are predominantly clear. Orbits are grossly unremarkable. Other: None. IMPRESSION: 1. No acute intracranial findings. 2. Scattered bilateral subcortical white matter calcifications, which per report were present on prior CT, and are likely sequela of prior infection/inflammation. Electronically Signed   By: Maudry Mayhew M.D.   On: 07/15/2021 21:08    EKG: Independently reviewed. Sinus rhythm, diffuse T-wave abnormalities.   Assessment/Plan   1. Blurred vision, confusion  - Patient presents with 6 days of blurred vision involving right eye and confusion  - Head CT with bilateral subcortical white matter calcifications suspicious for remote infection but no acute findings  -  She was severely hypertensive in ED but symptoms did not improve with lowering of BP  - She started Robaxin just prior to symptom onset and package insert warns of confusion and blurred vision but would not expect monocular vision change  - Check MRI brain and EEG, continue glycemic- and BP-control   2. Hypertensive urgency  - BP as high as 200/100 in ED, improved with labetalol  - Continue amlodipine, as-needed labetalol     3. Uncontrolled insulin-dependent DM  - A1c was 12.2% in August 2022  - Continue CBG checks and insulin    DVT prophylaxis: Lovenox  Code Status: Full  Level of Care: Level of care: Telemetry Medical Family Communication: Daughter updated at bedside  Disposition Plan:  Patient is from: Home   Anticipated d/c is to: TBD  Anticipated d/c date is: 07/17/21 Patient currently: Pending MRI brain  Consults called: none  Admission status: Inpatient     Briscoe Deutscher, MD [facility]  07/16/2021, 4:09 AM

## 2021-07-16 NOTE — Progress Notes (Signed)
Inpatient Diabetes Program Recommendations  AACE/ADA: New Consensus Statement on Inpatient Glycemic Control (2015)  Target Ranges:  Prepandial:   less than 140 mg/dL      Peak postprandial:   less than 180 mg/dL (1-2 hours)      Critically ill patients:  140 - 180 mg/dL   Lab Results  Component Value Date   GLUCAP 351 (H) 07/16/2021   HGBA1C 11.5 (H) 07/16/2021    Review of Glycemic Control  Diabetes history: DM2 Outpatient Diabetes medications: Lantus 15 units tid, and just changing to Lantus 20 units am + 21 units hs (and increase one unit q hs until fasting CBG <150) Current orders for Inpatient glycemic control: Lantus 15 units + Novolog correction 0-9 units tid + hs 0-5 units  Inpatient Diabetes Program Recommendations:   Consider: -Add Novolog 3 units tid meal coverage if eats 50%   Spoke with patient's daughter regarding amount insulin patient has currently been taking and she has been taking 15 units tid. States she has never taken the increased by NP note 8/25 to increase to 40 units bid. Daughter states patient was on short acting meal coverage but discontinued due to hypoglycemia. Reviewed basal insulin and short acting meal coverage and orders from NP. Patient had urgent care visit yesterday, but was not able to see her regular NP. Discussed importance of checking CBGs and reporting on physician appointments for insulin changes.  A1c has decreased from 13.4 on 03/25/21 to currently 11.5.  Thank you, Billy Fischer. Cayle Cordoba, RN, MSN, CDE  Diabetes Coordinator Inpatient Glycemic Control Team Team Pager [phone] (8am-5pm) 07/16/2021 4:33 PM

## 2021-07-16 NOTE — Progress Notes (Signed)
PROGRESS NOTE  MADILYNE TADLOCK WER:[id number] DOB: [birth year] DOA: 07/15/2021 PCP: [facility]   LOS: 0 days   Brief narrative: Lindsey Hart is a 65 y.o. female with medical history significant for hypertension and poorly controlled diabetes mellitus presented to hospital with blurred vision and confusion for 6 days.  Patient was recently started on Robaxin for muscle spasm but then started having blurry vision worsening on the right eye.  Patient does have baseline visual deficit but more pronounced now on the right side.  He does have short-term memory loss as well as per the family and had mild headache.  At baseline, patient is independent to ADLs.  In the ED patient was noted to be afebrile but had blood pressure elevated at 200/100. EKG showed sinus rhythm with diffuse T wave abnormality.  CT head scan was negative for acute finding but scattered bilateral subcortical white matter calcifications.  Troponin was negative.  Glucose was mildly elevated.  Alcohol level was negative.  COVID and flu was negative.  Patient was given labetalol and patient was admitted to the hospital.  Telemetry neurology was consulted.  Patient was then admitted to hospital for further evaluation and treatment.  Assessment/Plan:  Principal Problem:   Acute encephalopathy Active Problems:   Hypertensive urgency   Vision disturbance   Insulin-requiring or dependent type II diabetes mellitus (HCC)   Blurred vision, confusion  Ongoing for the last 6 days especially involving the right eye.  Head CT scan with by lateral subcortical white matter calcifications suspicious for remote infection.  Patient was hypotensive in the ED.  Patient was recently started on Robaxin.  MRI of the brain and EEG has been requested as were discussion with neurology curbside by the attending provider.  We will continue to monitor closely.  If negative will likely patient will benefit from follow-up with ophthalmologist    Mild confusion encephalopathy.  Patient had accelerated hypertension on presentation.  We will get MRI of the brain.  Check EEG.   Hypertensive urgency  - BP as high as 200/100 in ED, improved with labetalol, on amlodipine.  On as needed labetalol.   Uncontrolled insulin-dependent DM type II Hemoglobin A1c was 12.2% in August 2022.  Continue sliding scale insulin, Accu-Cheks and diabetic diet.  DVT prophylaxis: enoxaparin (LOVENOX) injection 40 mg Start: 07/16/21 1000   Code Status: Full code  Family Communication: None  Status is: Inpatient  Remains inpatient appropriate because:IV treatments appropriate due to intensity of illness or inability to take PO and Inpatient level of care appropriate due to severity of illness  Dispo: The patient is from: Home              Anticipated d/c is to: Home              Patient currently is not medically stable to d/c.   Difficult to place patient No  Consultants: None   Procedures: None  Anti-infectives:  None  Anti-infectives (From admission, onward)    None      Subjective: Today, patient was seen and examined at bedside.  Still complains of blurred vision on the right eye.  Denies any dizziness, headache, nausea, vomiting.  Objective: Vitals:   07/16/21 0455 07/16/21 0814  BP: (!) 148/71 140/69  Pulse: 64 65  Resp: 18 18  Temp: (!) 97.2 F (36.2 C) 98.5 F (36.9 C)  SpO2: 93% 100%    Intake/Output Summary (Last 24 hours) at 07/16/2021 1256 Last data filed at  07/16/2021 1030 Gross per 24 hour  Intake 240 ml  Output --  Net 240 ml   Filed Weights   07/15/21 2008 07/16/21 0012  Weight: 63.5 kg 62.3 kg   Body mass index is 25.13 kg/m.   Physical Exam: GENERAL: Patient is alert awake and oriented at this time. Not in obvious distress. HENT: No scleral pallor or icterus. Pupils equally reactive to light. Oral mucosa is moist NECK: is supple, no gross swelling noted. CHEST: Clear to auscultation. No crackles  or wheezes.  Diminished breath sounds bilaterally. CVS: S1 and S2 heard, no murmur. Regular rate and rhythm.  ABDOMEN: Soft, non-tender, bowel sounds are present. EXTREMITIES: No edema. CNS: Visual deficit on the right.  No focal motor deficits. SKIN: warm and dry without rashes.  Data Review: I have personally reviewed the following laboratory data and studies,  CBC: Recent Labs  Lab 07/15/21 2010 07/16/21 0254  WBC 5.9 4.9  NEUTROABS 2.7  --   HGB 12.7 11.8*  HCT 39.5 37.7  MCV 85.7 87.3  PLT 280 272   Basic Metabolic Panel: Recent Labs  Lab 07/15/21 2010 07/16/21 0254  NA 137 138  K 3.9 4.2  CL 103 108  CO2 27 25  GLUCOSE 148* 160*  BUN 13 15  CREATININE 0.73 0.71  CALCIUM 9.5 9.4   Liver Function Tests: Recent Labs  Lab 07/15/21 2010 07/16/21 0254  AST 22 23  ALT 33 30  ALKPHOS 73 60  BILITOT 0.6 0.4  PROT 7.4 6.2*  ALBUMIN 3.9 3.2*   No results for input(s): LIPASE, AMYLASE in the last 168 hours. Recent Labs  Lab 07/16/21 0254  AMMONIA 28   Cardiac Enzymes: No results for input(s): CKTOTAL, CKMB, CKMBINDEX, TROPONINI in the last 168 hours. BNP (last 3 results) No results for input(s): BNP in the last 8760 hours.  ProBNP (last 3 results) No results for input(s): PROBNP in the last 8760 hours.  CBG: Recent Labs  Lab 07/15/21 2002 07/16/21 0609  GLUCAP 148* 114*   Recent Results (from the past 240 hour(s))  Resp Panel by RT-PCR (Flu A&B, Covid) Nasopharyngeal Swab     Status: None   Collection Time: 07/15/21  8:34 PM   Specimen: Nasopharyngeal Swab; Nasopharyngeal(NP) swabs in vial transport medium  Result Value Ref Range Status   SARS Coronavirus 2 by RT PCR NEGATIVE NEGATIVE Final    Comment: (NOTE) SARS-CoV-2 target nucleic acids are NOT DETECTED.  The SARS-CoV-2 RNA is generally detectable in upper respiratory specimens during the acute phase of infection. The lowest concentration of SARS-CoV-2 viral copies this assay can detect  is 138 copies/mL. A negative result does not preclude SARS-Cov-2 infection and should not be used as the sole basis for treatment or other patient management decisions. A negative result may occur with  improper specimen collection/handling, submission of specimen other than nasopharyngeal swab, presence of viral mutation(s) within the areas targeted by this assay, and inadequate number of viral copies(<138 copies/mL). A negative result must be combined with clinical observations, patient history, and epidemiological information. The expected result is Negative.  Fact Sheet for Patients:  BloggerCourse.com  Fact Sheet for Healthcare Providers:  SeriousBroker.it  This test is no t yet approved or cleared by the [place] FDA and  has been authorized for detection and/or diagnosis of SARS-CoV-2 by FDA under an Emergency Use Authorization (EUA). This EUA will remain  in effect (meaning this test can be used) for the duration of the COVID-19 declaration under  Section 564(b)(1) of the Act, 21 U.S.C.section 360bbb-3(b)(1), unless the authorization is terminated  or revoked sooner.       Influenza A by PCR NEGATIVE NEGATIVE Final   Influenza B by PCR NEGATIVE NEGATIVE Final    Comment: (NOTE) The Xpert Xpress SARS-CoV-2/FLU/RSV plus assay is intended as an aid in the diagnosis of influenza from Nasopharyngeal swab specimens and should not be used as a sole basis for treatment. Nasal washings and aspirates are unacceptable for Xpert Xpress SARS-CoV-2/FLU/RSV testing.  Fact Sheet for Patients: BloggerCourse.com  Fact Sheet for Healthcare Providers: SeriousBroker.it  This test is not yet approved or cleared by the [place] FDA and has been authorized for detection and/or diagnosis of SARS-CoV-2 by FDA under an Emergency Use Authorization (EUA). This EUA will remain in effect  (meaning this test can be used) for the duration of the COVID-19 declaration under Section 564(b)(1) of the Act, 21 U.S.C. section 360bbb-3(b)(1), unless the authorization is terminated or revoked.  Performed at [facility], [address] [place], [place] [address]      Studies: CT HEAD WO CONTRAST  Result Date: 07/15/2021 CLINICAL DATA:  Altered mental status and confusion x6 days. Headache. EXAM: CT HEAD WITHOUT CONTRAST TECHNIQUE: Contiguous axial images were obtained from the base of the skull through the vertex without intravenous contrast. COMPARISON:  Report from CT October 14, 2006, however no comparison imaging available at time dictation. FINDINGS: Brain: Scattered bilateral subcortical white matter calcifications are present, which per report were present on prior CT, and are likely sequela of prior infection/inflammation. No evidence of acute large vascular territory infarction, hemorrhage, hydrocephalus, extra-axial collection or mass lesion/mass effect. Vascular: No hyperdense vessel. Atherosclerotic calcifications of the internal carotid arteries at the skull base. Skull: Normal. Negative for fracture or focal lesion. Sinuses/Orbits: Visualized portions of the paranasal sinuses and mastoid air cells are predominantly clear. Orbits are grossly unremarkable. Other: None. IMPRESSION: 1. No acute intracranial findings. 2. Scattered bilateral subcortical white matter calcifications, which per report were present on prior CT, and are likely sequela of prior infection/inflammation. Electronically Signed   By: Maudry Mayhew M.D.   On: 07/15/2021 21:08   EEG adult  Result Date: 07/16/2021 Charlsie Quest, MD     07/16/2021 12:24 PM Patient Name: Lindsey Hart MRN: [id number] Epilepsy Attending: Charlsie Quest Referring Physician/Provider: Dr Odie Sera Date: 07/16/2021 Duration: 22.55 mins Patient history: 2 old female presented with confusion and blurred vision.  EEG  evaluate for seizures. Level of alertness: Awake AEDs during EEG study: None Technical aspects: This EEG study was done with scalp electrodes positioned according to the 10-20 International system of electrode placement. Electrical activity was acquired at a sampling rate of 500Hz  and reviewed with a high frequency filter of 70Hz  and a low frequency filter of 1Hz . EEG data were recorded continuously and digitally stored. Description: The posterior dominant rhythm consists of 8-9 Hz activity of moderate voltage (25-35 uV) seen predominantly in posterior head regions, symmetric and reactive to eye opening and eye closing. Hyperventilation and photic stimulation were not performed.   IMPRESSION: This study is within normal limits. No seizures or epileptiform discharges were seen throughout the recording. Priyanka , MD  [facility] 07/16/2021  If 7PM-7AM, please contact night-coverage

## 2021-07-17 LAB — GLUCOSE, CAPILLARY
Glucose-Capillary: 186 mg/dL — ABNORMAL HIGH (ref 70–99)
Glucose-Capillary: 181 mg/dL — ABNORMAL HIGH (ref 70–99)

## 2021-07-17 LAB — BASIC METABOLIC PANEL
Anion gap: 6 (ref 5–15)
BUN: 20 mg/dL (ref 8–23)
CO2: 25 mmol/L (ref 22–32)
Calcium: 9.5 mg/dL (ref 8.9–10.3)
Chloride: 106 mmol/L (ref 98–111)
Creatinine, Ser: 0.7 mg/dL (ref 0.44–1.00)
GFR, Estimated: >60 mL/min (ref >60)
Glucose, Bld: 173 mg/dL — ABNORMAL HIGH (ref 70–99)
Potassium: 3.9 mmol/L (ref 3.5–5.1)
Sodium: 137 mmol/L (ref 135–145)

## 2021-07-17 LAB — CBC
HCT: 38.9 % (ref 36.0–46.0)
Hemoglobin: 12 g/dL (ref 12.0–15.0)
MCH: 27.1 pg (ref 26.0–34.0)
MCHC: 30.8 g/dL (ref 30.0–36.0)
MCV: 88 fL (ref 80.0–100.0)
Platelets: 249 10*3/uL (ref 150–400)
RBC: 4.42 MIL/uL (ref 3.87–5.11)
RDW: 13.7 % (ref 11.5–15.5)
WBC: 4.8 10*3/uL (ref 4.0–10.5)
nRBC: 0 % (ref 0.0–0.2)

## 2021-07-17 LAB — MAGNESIUM: Magnesium: 2.2 mg/dL (ref 1.7–2.4)

## 2021-07-17 MED ORDER — INSULIN GLARGINE-YFGN 100 UNIT/ML ~~LOC~~ SOLN
10.0000 [IU] | Freq: Every day | SUBCUTANEOUS | Status: DC
  Administered 2021-07-17: 10 [IU] via SUBCUTANEOUS
  Filled 2021-07-17: qty 0.1

## 2021-07-17 NOTE — Discharge Summary (Addendum)
Physician Discharge Summary  Lindsey Hart ZOX:[id number] DOB: 11/02/1956 DOA: 07/15/2021  [facility]  Admit date: 07/15/2021 Discharge date: 07/17/2021  Admitted From: Home  Discharge disposition: Home with home health  Recommendations for Outpatient Follow-Up:   Follow up with your primary care provider in one week.  Patient will need good diabetes and blood pressure control as outpatient. Check CBC, BMP, magnesium in the next visit Follow-up with ophthalmology Dr. Sherryll BurgerShah today for right-sided visual deficit.  Discharge Diagnosis:   Principal Problem:   Acute encephalopathy Active Problems:   Hypertensive urgency   Vision disturbance   Insulin-requiring or dependent type II diabetes mellitus (HCC)  Discharge Condition: Improved.  Diet recommendation: Low sodium, heart healthy.  Carbohydrate-modified.    Wound care: None.  Code status: Full.  History of Present Illness:   Lindsey Hart is a 65 y.o. female with medical history significant for hypertension and poorly controlled diabetes mellitus presented to hospital with blurred vision and confusion for 6 days.  Patient was recently started on Robaxin for muscle spasm but then started having blurry vision worsening on the right eye.  Patient does have baseline visual deficit but more pronounced now on the right side.  He does have short-term memory loss as well as per the family and had mild headache.  At baseline, patient is independent to ADLs.  In the ED patient was noted to be afebrile but had blood pressure elevated at 200/100. EKG showed sinus rhythm with diffuse T wave abnormality.  CT head scan was negative for acute finding but scattered bilateral subcortical white matter calcifications.  Troponin was negative.  Glucose was mildly elevated.  Alcohol level was negative.  COVID and flu was negative.  Patient was given labetalol and patient was admitted to the hospital.  Telemetry neurology was  consulted.  Patient was then admitted to hospital for further evaluation and treatment.  Hospital Course:   Following conditions were addressed during hospitalization as listed below,  Right-sided visual deficit, double vision Ongoing for the last 6 days especially involving the right eye.  Head CT scan with by lateral subcortical white matter calcifications suspicious for remote infection.  Patient was hypertensive in the ED.  Patient was recently started on Robaxin.  This has been taken off.  MRI of the brain showed some chronic small vessel disease.  Spoke with neurology who recommended ophthalmology follow-up. EEG did not show any evidence of seizure-like activity.  Spoke with Dr. Hedwig MortonShah [facility], ophthalmologist, for consultation and he suggested that the patient follow in the clinic today before 4 PM.  Spoke with the patient's daughter about keeping this appointment today.  Gross ocular exam unremarkable.  Patient could have diabetic/hypertensive retinopathy which needs ophthalmological evaluation.  Also seen by physical therapy recommended home health PT OT on discharge.   Mild confusion likely metabolic encephalopathy.  Patient had accelerated hypertension on presentation.  MRI of the brain and EEG unremarkable except for chronic small vessel disease.  Improved at this time.   Hypertensive urgency  - BP as high as 200/100 in ED, on amlodipine as outpatient.  Will resume.  Might need better blood pressure control as outpatient.    Uncontrolled insulin-dependent DM type II Hemoglobin A1c was 12.2% in August 2022.  Continue sliding scale insulin, Accu-Cheks and diabetic diet.  Patient is on Lantus as outpatient.  Will need to follow-up with primary care physician for better control of diabetes  Disposition.  At this time, patient is  stable for disposition home with outpatient PCP, ophthalmology follow-up.  Spoke with the patient's daughter at bedside regarding the plan for disposition and  ophthalmology follow-up.  Medical Consultants:   Verbal consult with neurology Verbal consult with ophthalmology  Procedures:    MRI of the brain EEG Subjective:   Today, patient was seen and examined at bedside.  Denies any dizziness, lightheadedness, shortness of breath, chest pain or palpitation.  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 Chest:  Clear breath sounds.  Diminished breath sounds bilaterally. No crackles or wheezes.  CVS: S1 &S2 heard. No murmur.  Regular rate and rhythm. Abdomen: Soft, nontender, nondistended.  Bowel sounds are heard.   Extremities: No cyanosis, clubbing or edema.  Peripheral pulses are palpable. 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  Moves all extremities. Skin: Warm and dry.  No rashes noted.  The results of significant diagnostics from this hospitalization (including imaging, microbiology, ancillary and laboratory) are listed below for reference.     Diagnostic Studies:   CT HEAD WO CONTRAST  Result Date: 07/15/2021 CLINICAL DATA:  Altered mental status and confusion x6 days. Headache. EXAM: CT HEAD WITHOUT CONTRAST TECHNIQUE: Contiguous axial images were obtained from the base of the skull through the vertex without intravenous contrast. COMPARISON:  Report from CT October 14, 2006, however no comparison imaging available at time dictation. FINDINGS: Brain:  Scattered bilateral subcortical white matter calcifications are present, which per report were present on prior CT, and are likely sequela of prior infection/inflammation. No evidence of acute large vascular territory infarction, hemorrhage, hydrocephalus, extra-axial collection or mass lesion/mass effect. Vascular: No hyperdense vessel. Atherosclerotic calcifications of the internal carotid arteries at the skull base. Skull: Normal. Negative for fracture or focal lesion. Sinuses/Orbits: Visualized portions of the paranasal sinuses and mastoid air cells are predominantly clear. Orbits are grossly unremarkable. Other: None. IMPRESSION: 1. No acute intracranial findings. 2. Scattered bilateral subcortical white matter calcifications, which per report were present on prior CT, and are likely sequela of prior infection/inflammation. Electronically Signed   By: Maudry Mayhew M.D.   On: 07/15/2021 21:08   MR BRAIN WO CONTRAST  Result Date: 07/17/2021 CLINICAL DATA:  Initial evaluation for mental status change, unknown cause. EXAM: MRI HEAD WITHOUT CONTRAST TECHNIQUE: Multiplanar, multiecho pulse sequences of the brain and surrounding structures were obtained without intravenous contrast. COMPARISON:  Head CT from 07/15/2021. FINDINGS: Brain: Cerebral volume within normal limits. Scattered patchy T2/FLAIR hyperintensity noted involving the periventricular, deep and subcortical white matter both cerebral hemispheres, nonspecific, but most commonly related to chronic microvascular ischemic disease. Overall, appearance is mild in nature. Probable tiny remote lacunar infarct noted at the right caudate body (series 10, image 16). No abnormal foci of  restricted diffusion to suggest acute or subacute ischemia. Gray-white matter differentiation maintained. No encephalomalacia to suggest chronic cortical infarction. No evidence for acute or chronic intracranial hemorrhage. Few scattered foci of susceptibility artifact related  to previously identified parenchymal calcifications noted, better appreciated on prior CT. No mass lesion, midline shift or mass effect. No hydrocephalus or extra-axial fluid collection. Pituitary gland suprasellar region within normal limits. Midline structures intact and normal. Vascular: Major intracranial vascular flow voids are maintained. Skull and upper cervical spine: Craniocervical junction within normal limits. Bone marrow signal intensity within normal limits. No scalp soft tissue abnormality. Sinuses/Orbits: Globes and orbital soft tissues within normal limits. Mild scattered mucosal thickening noted within the ethmoidal air cells and maxillary sinuses. Paranasal sinuses are otherwise clear. No significant mastoid effusion. Inner ear structures grossly normal. Other: None. IMPRESSION: 1. No acute intracranial abnormality. 2. Mild cerebral white matter disease, nonspecific, but most commonly related to chronic microvascular ischemic disease. 3. 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 Electronically Signed   By: Rise Mu M.D.   On: 07/17/2021 00:43   EEG adult  Result Date: 07/16/2021 Charlsie Quest, MD     07/16/2021 12:24 PM Patient Name: SOFIE SCHENDEL MRN: [id number] Epilepsy Attending: Charlsie Quest Referring Physician/Provider: Dr Odie Sera Date: 07/16/2021 Duration: 22.55 mins Patient history: 36 old female presented with confusion and blurred vision.  EEG evaluate for seizures. Level of alertness: Awake AEDs during EEG study: None Technical aspects: This EEG study was done with scalp electrodes positioned according to the 10-20 International system of electrode placement. Electrical activity was acquired at a sampling rate of 500Hz  and reviewed with a high frequency filter of 70Hz  and a low frequency filter of 1Hz . EEG data were recorded continuously and digitally stored. Description: The posterior dominant rhythm consists of 8-9 Hz activity of moderate voltage (25-35  uV) seen predominantly in posterior head regions, symmetric and reactive to eye opening and eye closing. Hyperventilation and photic stimulation were not performed.   IMPRESSION: This study is within normal limits. No seizures or epileptiform discharges were seen throughout the recording. Priyanka     Labs:   Basic Metabolic Panel: Recent Labs  Lab 07/15/21 2010 07/16/21 0254 07/17/21 0325  NA 137 138 137  K 3.9 4.2 3.9  CL 103 108 106  CO2 27 25 25   GLUCOSE 148* 160* 173*  BUN 13 15 20   CREATININE 0.73 0.71 0.70  CALCIUM 9.5 9.4 9.5  MG  --   --  2.2   GFR Estimated Creatinine Clearance: 60.8 mL/min (by C-G formula based on SCr of 0.7 mg/dL). Liver Function Tests: Recent Labs  Lab 07/15/21 2010 07/16/21 0254  AST 22 23  ALT 33 30  ALKPHOS 73 60  BILITOT 0.6 0.4  PROT 7.4 6.2*  ALBUMIN 3.9 3.2*   No results for input(s): LIPASE, AMYLASE in the last 168 hours. Recent Labs  Lab 07/16/21 0254  AMMONIA 28   Coagulation profile Recent Labs  Lab 07/15/21 2010  INR 0.9    CBC: Recent Labs  Lab 07/15/21 2010 07/16/21 0254 07/17/21 0325  WBC 5.9 4.9 4.8  NEUTROABS 2.7  --   --   HGB 12.7 11.8* 12.0  HCT 39.5 37.7 38.9  MCV 85.7 87.3 88.0  PLT 280 272 249   Cardiac Enzymes: No results for input(s): CKTOTAL, CKMB, CKMBINDEX, TROPONINI in the last 168 hours. BNP: Invalid input(s): POCBNP CBG: Recent Labs  Lab 07/16/21 1322 07/16/21 1625 07/16/21  1751 07/16/21 2120 07/17/21 0602  GLUCAP 351* 76 144* 162* 186*   D-Dimer No results for input(s): DDIMER in the last 72 hours. Hgb A1c Recent Labs    07/16/21 0254  HGBA1C 11.5*   Lipid Profile Recent Labs    07/16/21 0254  CHOL 226*  HDL 77  LDLCALC 135*  TRIG 70  CHOLHDL 2.9   Thyroid function studies Recent Labs    07/16/21 0254  TSH 2.997   Anemia work up Recent Labs    07/16/21 0254  VITAMINB12 490   Microbiology Recent Results (from the past 240 hour(s))  Resp Panel  by RT-PCR (Flu A&B, Covid) Nasopharyngeal Swab     Status: None   Collection Time: 07/15/21  8:34 PM   Specimen: Nasopharyngeal Swab; Nasopharyngeal(NP) swabs in vial transport medium  Result Value Ref Range Status   SARS Coronavirus 2 by RT PCR NEGATIVE NEGATIVE Final    Comment: (NOTE) SARS-CoV-2 target nucleic acids are NOT DETECTED.  The SARS-CoV-2 RNA is generally detectable in upper respiratory specimens during the acute phase of infection. The lowest concentration of SARS-CoV-2 viral copies this assay can detect is 138 copies/mL. A negative result does not preclude SARS-Cov-2 infection and should not be used as the sole basis for treatment or other patient management decisions. A negative result may occur with  improper specimen collection/handling, submission of specimen other than nasopharyngeal swab, presence of viral mutation(s) within the areas targeted by this assay, and inadequate number of viral copies(<138 copies/mL). A negative result must be combined with clinical observations, patient history, and epidemiological information. The expected result is Negative.  Fact Sheet for Patients:  BloggerCourse.com  Fact Sheet for Healthcare Providers:  SeriousBroker.it  This test is no t yet approved or cleared by the [place] FDA and  has been authorized for detection and/or diagnosis of SARS-CoV-2 by FDA under an Emergency Use Authorization (EUA). This EUA will remain  in effect (meaning this test can be used) for the duration of the COVID-19 declaration under Section 564(b)(1) of the Act, 21 U.S.C.section 360bbb-3(b)(1), unless the authorization is terminated  or revoked sooner.       Influenza A by PCR NEGATIVE NEGATIVE Final   Influenza B by PCR NEGATIVE NEGATIVE Final    Comment: (NOTE) The Xpert Xpress SARS-CoV-2/FLU/RSV plus assay is intended as an aid in the diagnosis of influenza from Nasopharyngeal swab  specimens and should not be used as a sole basis for treatment. Nasal washings and aspirates are unacceptable for Xpert Xpress SARS-CoV-2/FLU/RSV testing.  Fact Sheet for Patients: BloggerCourse.com  Fact Sheet for Healthcare Providers: SeriousBroker.it  This test is not yet approved or cleared by the [place] FDA and has been authorized for detection and/or diagnosis of SARS-CoV-2 by FDA under an Emergency Use Authorization (EUA). This EUA will remain in effect (meaning this test can be used) for the duration of the COVID-19 declaration under Section 564(b)(1) of the Act, 21 U.S.C. section 360bbb-3(b)(1), unless the authorization is terminated or revoked.  Performed at [facility], [address] [place], [place] [address]      Discharge Instructions:   Discharge Instructions     Diet - low sodium heart healthy   Complete by: As directed    Discharge instructions   Complete by: As directed    Follow-up with your primary care physician in 1 week to measure your blood pressure and focus on adequate blood glucose control.  Please follow-up with Dr. Hedwig Morton eye  today before 4 PM.  Appointment has already been made.   Increase activity slowly   Complete by: As directed       Allergies as of 07/17/2021   No Known Allergies      Medication List     STOP taking these medications    naproxen 500 MG tablet Commonly known as: NAPROSYN       TAKE these medications    amLODipine 10 MG tablet Commonly known as: NORVASC Take 10 mg by mouth daily.   Lantus SoloStar 100 UNIT/ML Solostar Pen Generic drug: insulin glargine Inject 15 Units into the skin 3 (three) times daily.        Follow-up Information     [facility] Schedule an appointment as soon as possible for a visit in 1 week(s).   Specialty: Internal Medicine Contact information: [address] [place] [address] [phone]         Elwin Mocha, MD. Go to.   Specialty: Ophthalmology Why: clinic today before 4 pm. Contact information: [address] [place] [address] [phone]                 Time coordinating discharge: 39 minutes  Signed:  Arrow Tomko  [facility] 07/17/2021, 11:06 AM

## 2021-07-17 NOTE — Plan of Care (Signed)
Problem: Education: Goal: Knowledge of General Education information will improve Description: Including pain rating scale, medication(s)/side effects and non-pharmacologic comfort measures 07/17/2021 1145 by Arcelia Jew, RN Outcome: Progressing 07/17/2021 1143 by Arcelia Jew, RN Outcome: Progressing   Problem: Health Behavior/Discharge Planning: Goal: Ability to manage health-related needs will improve 07/17/2021 1145 by Arcelia Jew, RN Outcome: Progressing 07/17/2021 1143 by Arcelia Jew, RN Outcome: Progressing   Problem: Clinical Measurements: Goal: Ability to maintain clinical measurements within normal limits will improve 07/17/2021 1145 by Arcelia Jew, RN Outcome: Progressing 07/17/2021 1143 by Arcelia Jew, RN Outcome: Progressing Goal: Will remain free from infection 07/17/2021 1145 by Arcelia Jew, RN Outcome: Progressing 07/17/2021 1143 by Arcelia Jew, RN Outcome: Progressing Goal: Diagnostic test results will improve 07/17/2021 1145 by Arcelia Jew, RN Outcome: Progressing 07/17/2021 1143 by Arcelia Jew, RN Outcome: Progressing Goal: Respiratory complications will improve 07/17/2021 1145 by Arcelia Jew, RN Outcome: Progressing 07/17/2021 1143 by Arcelia Jew, RN Outcome: Progressing Goal: Cardiovascular complication will be avoided 07/17/2021 1145 by Arcelia Jew, RN Outcome: Progressing 07/17/2021 1143 by Arcelia Jew, RN Outcome: Progressing   Problem: Activity: Goal: Risk for activity intolerance will decrease 07/17/2021 1145 by Arcelia Jew, RN Outcome: Progressing 07/17/2021 1143 by Arcelia Jew, RN Outcome: Progressing   Problem: Nutrition: Goal: Adequate nutrition will be maintained 07/17/2021 1145 by Arcelia Jew, RN Outcome: Progressing 07/17/2021 1143 by Arcelia Jew, RN Outcome: Progressing   Problem: Coping: Goal: Level of anxiety will decrease 07/17/2021 1145 by Arcelia Jew, RN Outcome:  Progressing 07/17/2021 1143 by Arcelia Jew, RN Outcome: Progressing   Problem: Elimination: Goal: Will not experience complications related to bowel motility 07/17/2021 1145 by Arcelia Jew, RN Outcome: Progressing 07/17/2021 1143 by Arcelia Jew, RN Outcome: Progressing Goal: Will not experience complications related to urinary retention 07/17/2021 1145 by Arcelia Jew, RN Outcome: Progressing 07/17/2021 1143 by Arcelia Jew, RN Outcome: Progressing   Problem: Pain Managment: Goal: General experience of comfort will improve 07/17/2021 1145 by Arcelia Jew, RN Outcome: Progressing 07/17/2021 1143 by Arcelia Jew, RN Outcome: Progressing   Problem: Safety: Goal: Ability to remain free from injury will improve 07/17/2021 1145 by Arcelia Jew, RN Outcome: Progressing 07/17/2021 1143 by Arcelia Jew, RN Outcome: Progressing   Problem: Skin Integrity: Goal: Risk for impaired skin integrity will decrease 07/17/2021 1145 by Arcelia Jew, RN Outcome: Progressing 07/17/2021 1143 by Arcelia Jew, RN Outcome: Progressing   Problem: Education: Goal: Knowledge of disease or condition will improve 07/17/2021 1145 by Arcelia Jew, RN Outcome: Progressing 07/17/2021 1143 by Arcelia Jew, RN Outcome: Progressing Goal: Knowledge of secondary prevention will improve 07/17/2021 1145 by Arcelia Jew, RN Outcome: Progressing 07/17/2021 1143 by Arcelia Jew, RN Outcome: Progressing Goal: Knowledge of patient specific risk factors addressed and post discharge goals established will improve 07/17/2021 1145 by Arcelia Jew, RN Outcome: Progressing 07/17/2021 1143 by Arcelia Jew, RN Outcome: Progressing Goal: Individualized Educational Video(s) 07/17/2021 1145 by Arcelia Jew, RN Outcome: Progressing 07/17/2021 1143 by Arcelia Jew, RN Outcome: Progressing   Problem: Coping: Goal: Will verbalize positive feelings about self 07/17/2021 1145 by Arcelia Jew,  RN Outcome: Progressing 07/17/2021 1143 by Arcelia Jew, RN Outcome: Progressing Goal: Will identify appropriate support needs 07/17/2021 1145 by Arcelia Jew, RN Outcome: Progressing 07/17/2021 1143 by Arcelia Jew, RN Outcome: Progressing  Problem: Health Behavior/Discharge Planning: Goal: Ability to manage health-related needs will improve 07/17/2021 1145 by Arcelia Jew, RN Outcome: Progressing 07/17/2021 1143 by Arcelia Jew, RN Outcome: Progressing   Problem: Self-Care: Goal: Ability to participate in self-care as condition permits will improve 07/17/2021 1145 by Arcelia Jew, RN Outcome: Progressing 07/17/2021 1143 by Arcelia Jew, RN Outcome: Progressing Goal: Verbalization of feelings and concerns over difficulty with self-care will improve 07/17/2021 1145 by Arcelia Jew, RN Outcome: Progressing 07/17/2021 1143 by Arcelia Jew, RN Outcome: Progressing Goal: Ability to communicate needs accurately will improve 07/17/2021 1145 by Arcelia Jew, RN Outcome: Progressing 07/17/2021 1143 by Arcelia Jew, RN Outcome: Progressing   Problem: Nutrition: Goal: Risk of aspiration will decrease 07/17/2021 1145 by Arcelia Jew, RN Outcome: Progressing 07/17/2021 1143 by Arcelia Jew, RN Outcome: Progressing Goal: Dietary intake will improve 07/17/2021 1145 by Arcelia Jew, RN Outcome: Progressing 07/17/2021 1143 by Arcelia Jew, RN Outcome: Progressing   Problem: Education: Goal: Ability to describe self-care measures that may prevent or decrease complications (Diabetes Survival Skills Education) will improve Outcome: Progressing Goal: Individualized Educational Video(s) Outcome: Progressing   Problem: Coping: Goal: Ability to adjust to condition or change in health will improve Outcome: Progressing   Problem: Fluid Volume: Goal: Ability to maintain a balanced intake and output will improve Outcome: Progressing   Problem: Health  Behavior/Discharge Planning: Goal: Ability to identify and utilize available resources and services will improve Outcome: Progressing Goal: Ability to manage health-related needs will improve Outcome: Progressing   Problem: Metabolic: Goal: Ability to maintain appropriate glucose levels will improve Outcome: Progressing   Problem: Nutritional: Goal: Maintenance of adequate nutrition will improve Outcome: Progressing Goal: Progress toward achieving an optimal weight will improve Outcome: Progressing   Problem: Skin Integrity: Goal: Risk for impaired skin integrity will decrease Outcome: Progressing   Problem: Tissue Perfusion: Goal: Adequacy of tissue perfusion will improve Outcome: Progressing

## 2021-07-17 NOTE — TOC Transition Note (Signed)
Transition of Care [facility]) - CM/SW Discharge Note   Patient Details  Name: Lindsey Hart MRN: [id number] Date of Birth: [birth year]  [facility] CM/SW Contact:  Leone Haven, RN Phone Number: 07/17/2021, 12:10 PM   Clinical Narrative:    NCM offered choice to patient and daughter, daughter chose 45.  NCM made referral to Amy with [facility] she states she can take referral.  Soc will begin 24 to 48 hrs post dc.  Daughter also would like a rolling walker and she is ok with [facility] supplying it.  NCM made referral to [facility] with [facility], they will bring the walker to patient room prior to dc.    Final next level of care: Home w Home Health Services Barriers to Discharge: No Barriers Identified   Patient Goals and CMS Choice Patient states their goals for this hospitalization and ongoing recovery are:: return home with [facility] CMS Medicare.gov Compare Post Acute Care list provided to:: Patient Represenative (must comment) Choice offered to / list presented to : Adult Children  Discharge Placement                       Discharge Plan and Services                DME Arranged: Walker rolling DME Agency: [facility] Date DME Agency Contacted: 07/17/21 Time DME Agency Contacted: 1201 Representative spoke with at DME Agency: Amy HH Arranged: PT HH Agency: [facility] Date [facility] Agency Contacted: 07/17/21 Time HH Agency Contacted: 1202 Representative spoke with at [facility] Agency: Amy  Social Determinants of Health (SDOH) Interventions     Readmission Risk Interventions No flowsheet data found.

## 2021-07-17 NOTE — Evaluation (Signed)
Physical Therapy Evaluation Patient Details Name: Lindsey Hart MRN: [id number] DOB: [birth year] Today's Date: 07/17/2021   History of Present Illness  Pt is a 65 y.o. female who presented 07/15/21 with confusion and acute onset blurred vision. She was severely hypertensive in ED but symptoms did not improve with lowering of BP. EKG features sinus rhythm with diffuse T wave abnormality.  Head CT and MRI negative for acute findings but notable for scattered bilateral subcortical white matter calcifications and tiny remote lacunar infarct at the right caudate. UDS and UA are unremarkable. EEG was normal. PMH: DM, HTN   Clinical Impression  Pt presents with condition above and deficits mentioned below, see PT Problem List. PTA, pt was independent with all functional mobility and ADLs, driving, and working at a factory. Pt lives in a 1-level house with 1 STE with her daughter, who also works several jobs. Per daughter, pt has a hx of L leg weakness and sensation deficits and recently had R carpal tunnel surgery. Pt demonstrates deficits in R hand strength, L leg strength, L leg sensation, gross coordination, balance, and vision in her R eye. Daughter reports she has noticed that the pt has become more forgetful and has had difficulty finding words to express herself since the onset of her acute visual issues. Pt's daughter translated during the session. Pt reported double vision with both eyes open, resulting in her having difficulty negotiating her environment safely. However, when her R eye was covered her vision improved and she was able to negotiate her environment without cues. Pt reports improved confidence and displays improved speed and fluidity of gait when using a RW than when not using one. Educated pt and daughter to try to use a R eye patch to allow pt to negotiate her home safely when the daughter is at work. Educated them to allow the daughter to manage finances, meds, and cooking and for the pt to  not leave the house when the daughter is not home at this time. Will continue to follow acutely. Pt would benefit from follow-up with HHPT and HHOT.    Follow Up Recommendations Home health PT;Supervision - Intermittent    Equipment Recommendations  Rolling walker with 5" wheels    Recommendations for Other Services OT consult;Speech consult     Precautions / Restrictions Precautions Precautions: Fall Precaution Comments: double vision, better with R eye covered Restrictions Weight Bearing Restrictions: No      Mobility  Bed Mobility Overal bed mobility: Modified Independent             General bed mobility comments: Pt able to transition supine <> sit with HOB elevated without assistance.    Transfers Overall transfer level: Needs assistance Equipment used: Rolling walker (2 wheeled);None Transfers: Sit to/from Stand Sit to Stand: Min guard         General transfer comment: Sit to stand 1x from EOB to RW and 1x from EOB without UE support with min guard for safety, no LOB but extra time to power up to stand.  Ambulation/Gait Ambulation/Gait assistance: Min guard Gait Distance (Feet): 225 Feet (x2 bouts of ~225 ft > ~30 ft) Assistive device: Rolling walker (2 wheeled);None Gait Pattern/deviations: Step-through pattern;Decreased stride length;Shuffle Gait velocity: reduced Gait velocity interpretation: <1.8 ft/sec, indicate of risk for recurrent falls General Gait Details: With both eyes open, pt cued to walk towards door, with pt displaying a slow, shuffling gait pattern with RW. Pt began to walk to curtain and wall instead, thereby needing  verbal and intermittent tactile cues to direct. When R eye was covered she increased her speed and stride length and was able to negotiate around all obstacles and return to her room without LOB or cues. Progressed from using RW to no UE support with R eye covered with no LOB, min guard for safety. However, pt is slower and more  cautious without RW than with it.  Stairs            Wheelchair Mobility    Modified Rankin (Stroke Patients Only) Modified Rankin (Stroke Patients Only) Pre-Morbid Rankin Score: No symptoms Modified Rankin: Moderately severe disability     Balance Overall balance assessment: Needs assistance Sitting-balance support: No upper extremity supported;Feet supported Sitting balance-Leahy Scale: Good     Standing balance support: No upper extremity supported;During functional activity Standing balance-Leahy Scale: Good Standing balance comment: Able to ambulate without UE support, but displays compensatory techniques, like shuffling feet or slowing gait to accomodate for balance challenges.                             Pertinent Vitals/Pain Pain Assessment: Faces Faces Pain Scale: No hurt Pain Intervention(s): Monitored during session    Home Living Family/patient expects to be discharged to:: Private residence Living Arrangements: Children (daughter) Available Help at Discharge: Family;Available PRN/intermittently Type of Home: House Home Access: Stairs to enter Entrance Stairs-Rails: None Entrance Stairs-Number of Steps: 1 Home Layout: One level Home Equipment: Shower seat      Prior Function Level of Independence: Independent         Comments: Pt drives and works in a factory.     Hand Dominance   Dominant Hand: Right    Extremity/Trunk Assessment   Upper Extremity Assessment Upper Extremity Assessment: RUE deficits/detail (denies numbness/tingling bil UEs; grossly 4 to 5 bil, fairly symmetrical; difficulty with finger to nose <> finger likely due to visual deficits but able to alternate palms up and down without deficits noted) RUE Deficits / Details: Mild hand weakness as pt's daughter reports pt recently had carpal tunnel surgery    Lower Extremity Assessment Lower Extremity Assessment: LLE deficits/detail (R MMT scores of 4+ to 5 grossly,  denied numbness/tingling in R) LLE Deficits / Details: Hx of numbness and weakness in L leg (at least several years); MMT scores of 4- hip flexion, 3 knee extension, 2+ ankle dorsiflexion LLE Sensation: decreased light touch    Cervical / Trunk Assessment Cervical / Trunk Assessment: Kyphotic  Communication   Communication: Prefers language other than English ([place])  Cognition Arousal/Alertness: Awake/alert Behavior During Therapy: Flat affect Overall Cognitive Status: Impaired/Different from baseline Area of Impairment: Memory                     Memory: Decreased short-term memory         General Comments: Per daughter: Pt forgot pot on stove at home. Pt difficulty staying on train of thought and expressing self, described as when a kid tries to explian something but "goes around the whole thing" instead of just stating it directly.      General Comments General comments (skin integrity, edema, etc.): Daughter reports pt can see some out of her R eye but was unable to identify correct colors of objects when L eye was covered, double vision when both eyes are open, improved vision with R eye covered; educated pt and daughter on use of eye patch to allow pt to  mobilize more safely around the home when daughter is at work; educated them on keeping a Actuary and having daughter do cooking, finances, and meds for safety purposes at this time; daughter translated during session    Exercises     Assessment/Plan    PT Assessment Patient needs continued PT services  PT Problem List Decreased strength;Decreased range of motion;Decreased balance;Decreased mobility;Decreased coordination;Decreased cognition;Decreased knowledge of use of DME;Impaired sensation       PT Treatment Interventions DME instruction;Gait training;Stair training;Therapeutic activities;Functional mobility training;Therapeutic exercise;Balance training;Neuromuscular re-education;Cognitive  remediation;Patient/family education    PT Goals (Current goals can be found in the Care Plan section)  Acute Rehab PT Goals Patient Stated Goal: to get back to work PT Goal Formulation: With patient/family Time For Goal Achievement: 07/31/21 Potential to Achieve Goals: Good    Frequency Min 4X/week   Barriers to discharge        Co-evaluation               AM-PAC PT "6 Clicks" Mobility  Outcome Measure Help needed turning from your back to your side while in a flat bed without using bedrails?: None Help needed moving from lying on your back to sitting on the side of a flat bed without using bedrails?: None Help needed moving to and from a bed to a chair (including a wheelchair)?: A Little Help needed standing up from a chair using your arms (e.g., wheelchair or bedside chair)?: A Little Help needed to walk in hospital room?: A Little Help needed climbing 3-5 steps with a railing? : A Little 6 Click Score: 20    End of Session Equipment Utilized During Treatment: Gait belt Activity Tolerance: Patient tolerated treatment well Patient left: in bed;with call bell/phone within reach;with bed alarm set;with family/visitor present Nurse Communication: Mobility status;Other (comment) (improved navigation of environment with R eye covered) PT Visit Diagnosis: Unsteadiness on feet (R26.81);Other abnormalities of gait and mobility (R26.89);Muscle weakness (generalized) (M62.81);Difficulty in walking, not elsewhere classified (R26.2)    Time: 7106-2694 PT Time Calculation (min) (ACUTE ONLY): 32 min   Charges:   PT Evaluation $PT Eval Moderate Complexity: 1 Mod PT Treatments $Gait Training: 8-22 mins        Raymond Gurney, PT, DPT Acute Rehabilitation Services  Pager: [phone] Office: [phone]   Jewel Baize 07/17/2021, 11:27 AM

## 2022-01-09 DIAGNOSIS — Z8673 Personal history of transient ischemic attack (TIA), and cerebral infarction without residual deficits: Secondary | ICD-10-CM | POA: Insufficient documentation

## 2022-07-01 IMAGING — MR MR HEAD W/O CM
12 of 13 series · 44 of 48 positions shown · non-contrast
Comparison: Head CT from 07/15/2021.

CLINICAL DATA: Initial evaluation for mental status change, unknown
cause.

EXAM:
MRI HEAD WITHOUT CONTRAST
TECHNIQUE: Multiplanar, multiecho pulse sequences of the brain and surrounding
structures were obtained without intravenous contrast.

[Series 5: DWI · axial · 3.0mm · 0.88mm/px · z∈[-75,+74]mm · 8 of 102 slices shown (1 of 4)]
[im 1/102]
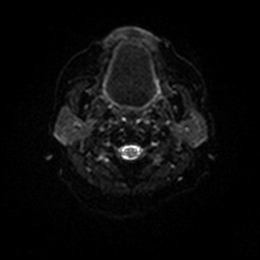
[im 15/102]
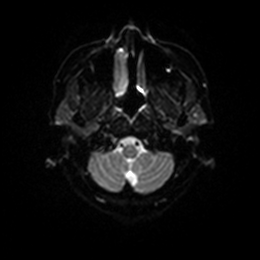
[im 29/102]
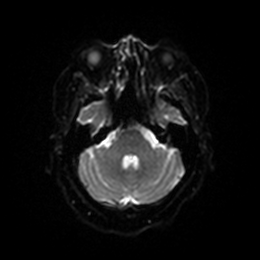
[im 44/102]
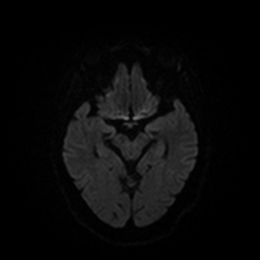
[im 58/102]
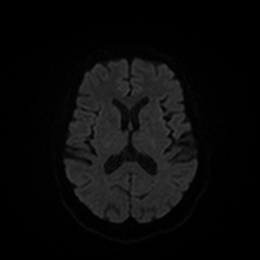
[im 73/102]
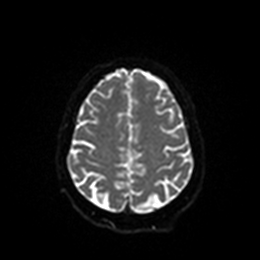
[im 87/102]
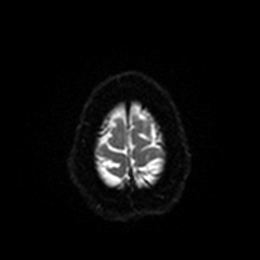
[im 102/102]
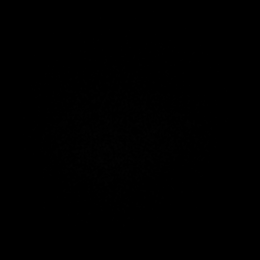

[Series 6: DWI · axial · 3.0mm · 0.88mm/px · z∈[-75,+71]mm · 4 of 50 slices shown (2 of 4)]
[im 1/50]
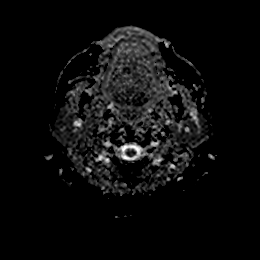
[im 17/50]
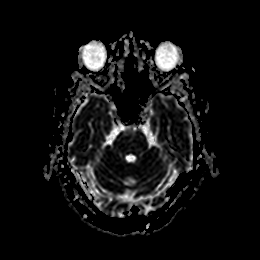
[im 33/50]
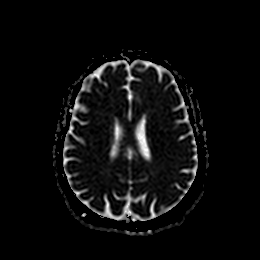
[im 50/50]
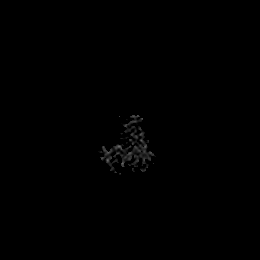

[Series 7: DWI · coronal · 4.0mm · 0.88mm/px · 6 of 72 slices shown (3 of 4)]
[im 1/72]
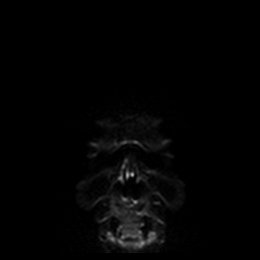
[im 15/72]
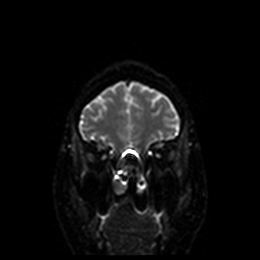
[im 29/72]
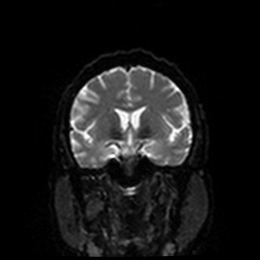
[im 43/72]
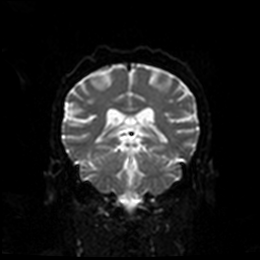
[im 57/72]
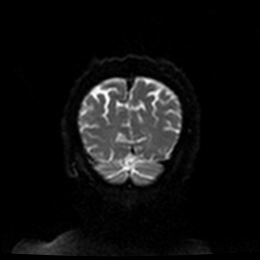
[im 72/72]
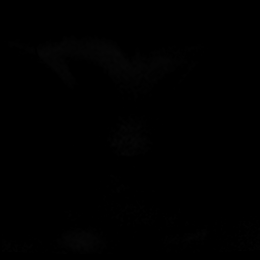

[Series 8: DWI · coronal · 4.0mm · 0.88mm/px · 3 of 36 slices shown (4 of 4)]
[im 1/36]
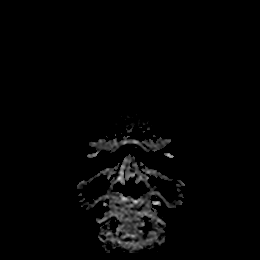
[im 18/36]
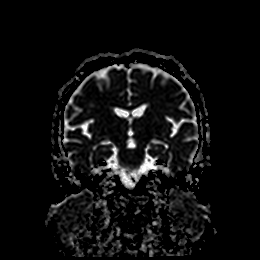
[im 36/36]
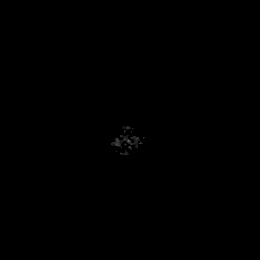

[Series 9: T1 · sagittal · 5.0mm · 0.75mm/px · 2 of 26 slices shown]
[im 1/26]
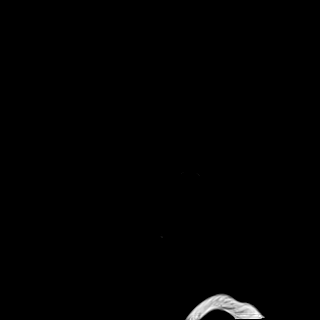
[im 26/26]
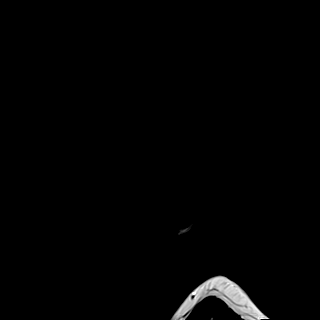

[Series 10: T2 · axial · 5.0mm · 0.72mm/px · z∈[-78,+77]mm · 2 of 27 slices shown (1 of 2)]
[im 1/27]
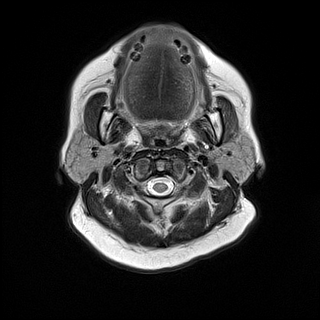
[im 27/27]
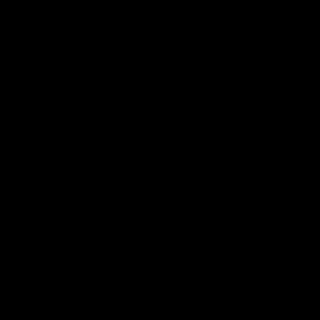

[Series 11: FLAIR · axial · 5.0mm · 0.45mm/px · z∈[-79,+77]mm · 2 of 27 slices shown]
[im 1/27]
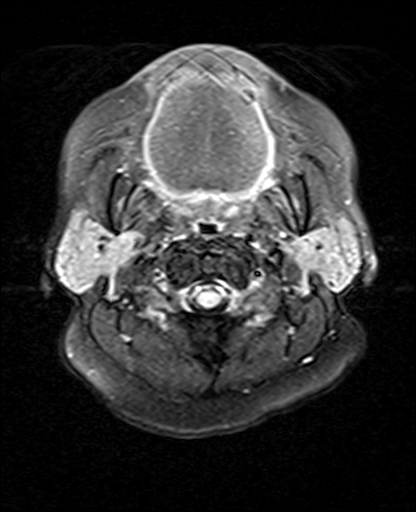
[im 27/27]
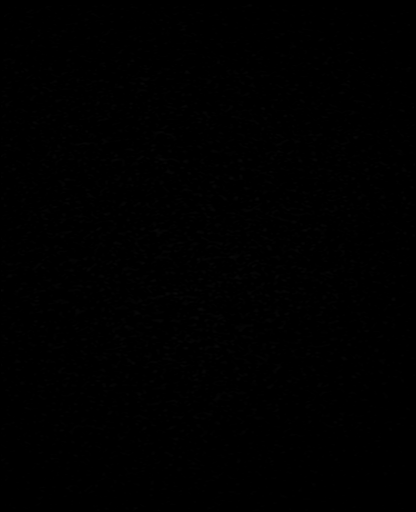

[Series 12: mag_images · axial · 3.0mm · 0.90mm/px · z∈[-77,+76]mm · 4 of 52 slices shown]
[im 1/52]
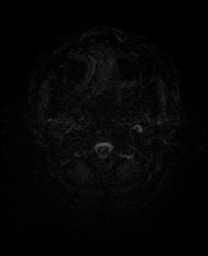
[im 18/52]
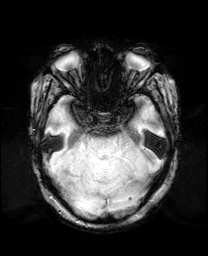
[im 35/52]
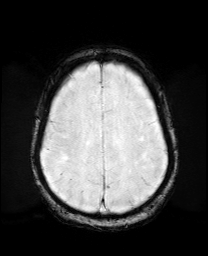
[im 52/52]
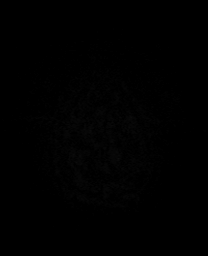

[Series 13: pha_images · axial · 3.0mm · 0.90mm/px · z∈[-77,+73]mm · 4 of 50 slices shown]
[im 1/50]
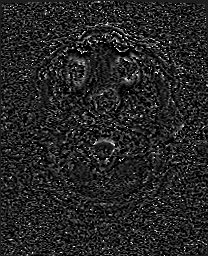
[im 17/50]
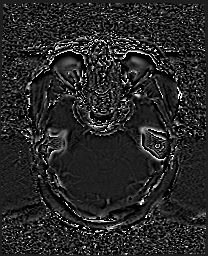
[im 33/50]
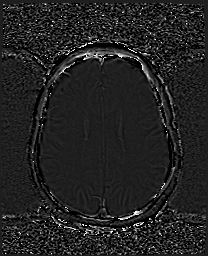
[im 50/50]
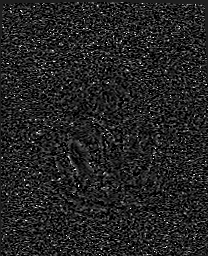

[Series 14: swi_images · axial · 3.0mm · 0.90mm/px · z∈[-77,+76]mm · 4 of 52 slices shown]
[im 1/52]
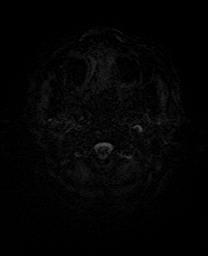
[im 18/52]
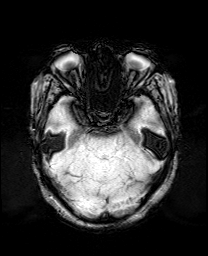
[im 35/52]
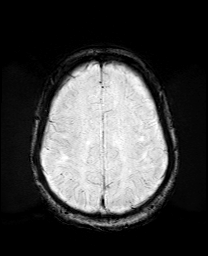
[im 52/52]
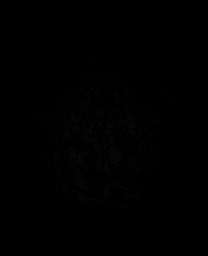

[Series 15: mip_images(sw) · axial · 24.0mm · 0.90mm/px · z∈[-67,+65]mm · 3 of 45 slices shown]
[im 1/45]
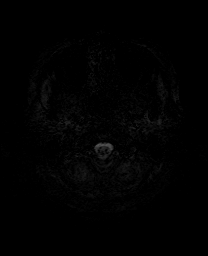
[im 23/45]
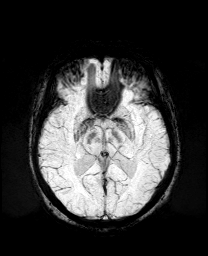
[im 45/45]
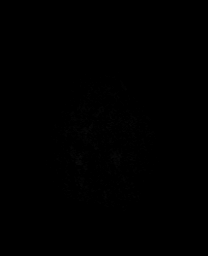

[Series 17: T2 · coronal · 5.0mm · 0.34mm/px · 2 of 30 slices shown (2 of 2)]
[im 1/30]
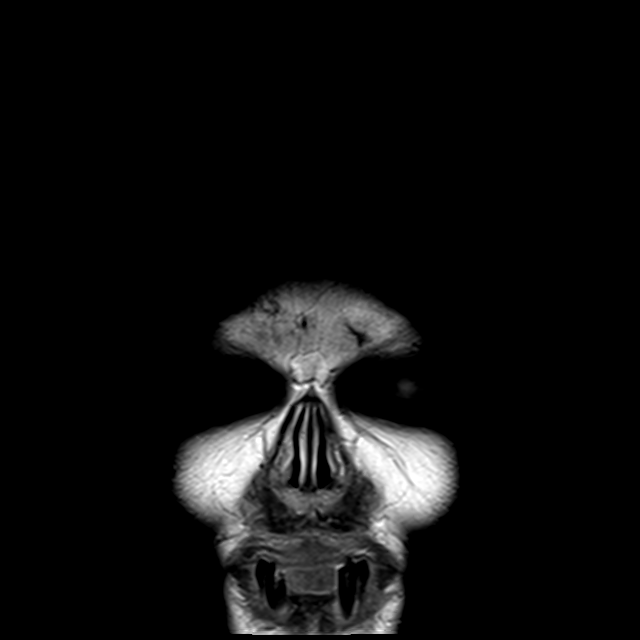
[im 30/30]
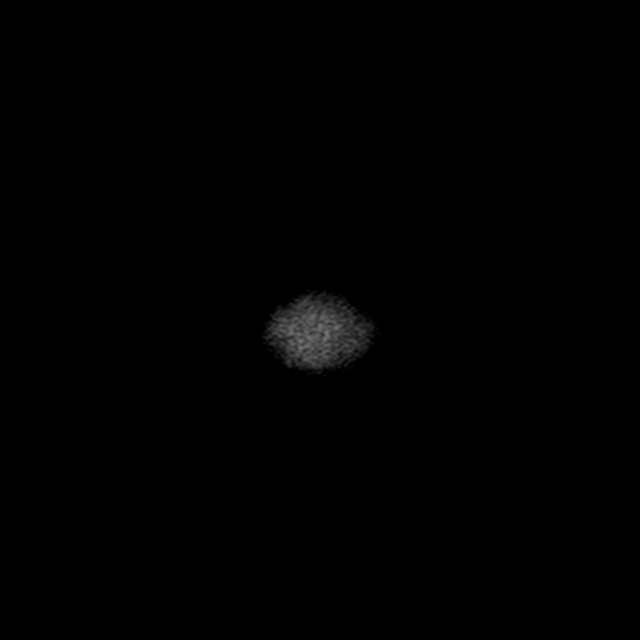

[44 of 48 positions shown; findings below may reference images not displayed]

FINDINGS: Brain: Cerebral volume within normal limits. Scattered patchy
T2/FLAIR hyperintensity noted involving the periventricular, deep
and subcortical white matter both cerebral hemispheres, nonspecific,
but most commonly related to chronic microvascular ischemic disease.
Overall, appearance is mild in nature. Probable tiny remote lacunar
infarct noted at the right caudate body (series 10, image 16).

No abnormal foci of restricted diffusion to suggest acute or
subacute ischemia. Gray-white matter differentiation maintained. No
encephalomalacia to suggest chronic cortical infarction. No evidence
for acute or chronic intracranial hemorrhage. Few scattered foci of
susceptibility artifact related to previously identified parenchymal
calcifications noted, better appreciated on prior CT.

No mass lesion, midline shift or mass effect. No hydrocephalus or
extra-axial fluid collection. Pituitary gland suprasellar region
within normal limits. Midline structures intact and normal.

Vascular: Major intracranial vascular flow voids are maintained.

Skull and upper cervical spine: Craniocervical junction within
normal limits. Bone marrow signal intensity within normal limits. No
scalp soft tissue abnormality.

Sinuses/Orbits: Globes and orbital soft tissues within normal
limits. Mild scattered mucosal thickening noted within the ethmoidal
air cells and maxillary sinuses. Paranasal sinuses are otherwise
clear. No significant mastoid effusion. Inner ear structures grossly
normal.

Other: None.
IMPRESSION: 1. No acute intracranial abnormality.
2. Mild cerebral white matter disease, nonspecific, but most
commonly related to chronic microvascular ischemic disease.
3. Tiny remote lacunar infarct at the right caudate.

## 2023-05-06 ENCOUNTER — Emergency Department (HOSPITAL_BASED_OUTPATIENT_CLINIC_OR_DEPARTMENT_OTHER): Payer: Self-pay

## 2023-05-06 ENCOUNTER — Encounter (HOSPITAL_BASED_OUTPATIENT_CLINIC_OR_DEPARTMENT_OTHER): Payer: Self-pay

## 2023-05-06 ENCOUNTER — Other Ambulatory Visit: Payer: Self-pay

## 2023-05-06 ENCOUNTER — Emergency Department (HOSPITAL_BASED_OUTPATIENT_CLINIC_OR_DEPARTMENT_OTHER)
Admission: EM | Admit: 2023-05-06 | Discharge: 2023-05-06 | Disposition: A | Discharge status: Home / Self Care | Payer: Self-pay | Attending: Emergency Medicine | Admitting: Emergency Medicine

## 2023-05-06 DIAGNOSIS — R0789 Other chest pain: Secondary | ICD-10-CM

## 2023-05-06 DIAGNOSIS — K2900 Acute gastritis without bleeding: Secondary | ICD-10-CM | POA: Insufficient documentation

## 2023-05-06 DIAGNOSIS — R2241 Localized swelling, mass and lump, right lower limb: Secondary | ICD-10-CM | POA: Insufficient documentation

## 2023-05-06 DIAGNOSIS — E119 Type 2 diabetes mellitus without complications: Secondary | ICD-10-CM | POA: Insufficient documentation

## 2023-05-06 DIAGNOSIS — I10 Essential (primary) hypertension: Secondary | ICD-10-CM | POA: Insufficient documentation

## 2023-05-06 DIAGNOSIS — R2689 Other abnormalities of gait and mobility: Secondary | ICD-10-CM | POA: Insufficient documentation

## 2023-05-06 LAB — LIPASE, BLOOD: Lipase: 48 U/L (ref 11–51)

## 2023-05-06 LAB — BASIC METABOLIC PANEL
Anion gap: 11 (ref 5–15)
BUN: 15 mg/dL (ref 8–23)
CO2: 22 mmol/L (ref 22–32)
Calcium: 9.3 mg/dL (ref 8.9–10.3)
Chloride: 101 mmol/L (ref 98–111)
Creatinine, Ser: 0.68 mg/dL (ref 0.44–1.00)
GFR, Estimated: >60 mL/min (ref >60)
Glucose, Bld: 248 mg/dL — ABNORMAL HIGH (ref 70–99)
Potassium: 3.9 mmol/L (ref 3.5–5.1)
Sodium: 134 mmol/L — ABNORMAL LOW (ref 135–145)

## 2023-05-06 LAB — CBC
HCT: 43.2 % (ref 36.0–46.0)
Hemoglobin: 13.8 g/dL (ref 12.0–15.0)
MCH: 27.3 pg (ref 26.0–34.0)
MCHC: 31.9 g/dL (ref 30.0–36.0)
MCV: 85.5 fL (ref 80.0–100.0)
Platelets: 280 10*3/uL (ref 150–400)
RBC: 5.05 MIL/uL (ref 3.87–5.11)
RDW: 13.1 % (ref 11.5–15.5)
WBC: 5.8 10*3/uL (ref 4.0–10.5)
nRBC: 0 % (ref 0.0–0.2)

## 2023-05-06 LAB — TROPONIN I (HIGH SENSITIVITY)
Troponin I (High Sensitivity): 4 ng/L (ref <18)
Troponin I (High Sensitivity): 3 ng/L (ref <18)

## 2023-05-06 LAB — D-DIMER, QUANTITATIVE: D-Dimer, Quant: <0.27 ug/mL-FEU (ref 0.00–0.50)

## 2023-05-06 MED ORDER — ACETAMINOPHEN 500 MG PO TABS
1000.0000 mg | ORAL_TABLET | Freq: Once | ORAL | Status: AC
  Administered 2023-05-06: 1000 mg via ORAL
  Filled 2023-05-06: qty 2

## 2023-05-06 MED ORDER — FAMOTIDINE 20 MG PO TABS: 20.0000 mg | ORAL_TABLET | Freq: Two times a day (BID) | ORAL | 0 refills | Status: AC

## 2023-05-06 MED ORDER — PANTOPRAZOLE SODIUM 40 MG IV SOLR
40.0000 mg | Freq: Once | INTRAVENOUS | Status: AC
  Administered 2023-05-06: 40 mg via INTRAVENOUS
  Filled 2023-05-06: qty 10

## 2023-05-06 MED ORDER — ONDANSETRON HCL 4 MG/2ML IJ SOLN
4.0000 mg | Freq: Once | INTRAMUSCULAR | Status: AC
  Administered 2023-05-06: 4 mg via INTRAVENOUS
  Filled 2023-05-06: qty 2

## 2023-05-06 MED ORDER — ONDANSETRON 4 MG PO TBDP: 4.0000 mg | ORAL_TABLET | Freq: Three times a day (TID) | ORAL | 0 refills | Status: AC | PRN

## 2023-05-06 MED ORDER — ALUM & MAG HYDROXIDE-SIMETH 200-200-20 MG/5ML PO SUSP
30.0000 mL | Freq: Once | ORAL | Status: AC
  Administered 2023-05-06: 30 mL via ORAL
  Filled 2023-05-06: qty 30

## 2023-05-06 MED ORDER — LIDOCAINE VISCOUS HCL 2 % MT SOLN
15.0000 mL | Freq: Once | OROMUCOSAL | Status: AC
  Administered 2023-05-06: 15 mL via ORAL
  Filled 2023-05-06: qty 15

## 2023-05-06 NOTE — ED Triage Notes (Signed)
Patient having chest pain for one month. She stated it hurts to wear a bra over that area. She is complaining of shortness of breath and vomiting when she eats.

## 2023-05-06 NOTE — Discharge Instructions (Addendum)
Thank you for coming to [facility] Emergency Department. You were seen for intermittent left-sided chest pain. We did an exam, labs, and imaging, and these showed no acute findings.  You improved with treatment for gastritis, or irritation of the lining of the stomach due to acid.  Please take Pepcid twice per day at home.  You can also take Tylenol 1 g 3 times per day for pain and Zofran under the tongue every 8 hours as needed for nausea vomiting. Please follow up with your primary care provider within 1 week.  For your chest pain we have also placed a referral to cardiology.  They will call you sometime in the next 1 to 2 weeks to make an appointment.  Do not hesitate to return to the ED or call 911 if you experience: -Worsening symptoms -Worsening chest pain, shortness of breath -Nausea vomiting so severe you cannot eat or drink anything at home -Lightheadedness, passing out -Fevers/chills -Anything else that concerns you

## 2023-05-06 NOTE — ED Provider Notes (Signed)
[facility] Provider Note   CSN: [id number] Arrival date & time: 05/06/23  1638     History  Chief Complaint  Patient presents with   Chest Pain    Lindsey Hart is a 67 y.o. female with HTN, T2DM on insulin HLD, history of CVA presents with chest pain intermittently for months but today it is worse and associated w/ nausea/vomiting. +SOB as well with no cough or hemoptysis. No recent hospitalizations/surgeries. She stated it hurts to wear a bra over that area. Endorses mild leg swelling R>L sometimes. Denies positional changes in pain ut endorses pain with palpation. No trauma/falls. Lives with her daughter who provides translation during interview.  Has weakness in the left side at baseline due to prior stroke. Tylenol has intermittently helped the pain.   Chest Pain      Home Medications Prior to Admission medications   Medication Sig Start Date End Date Taking? Authorizing Provider  famotidine (PEPCID) 20 MG tablet Take 1 tablet (20 mg total) by mouth 2 (two) times daily. 05/06/23  Yes Loetta Rough, MD  ondansetron (ZOFRAN-ODT) 4 MG disintegrating tablet Take 1 tablet (4 mg total) by mouth every 8 (eight) hours as needed for nausea or vomiting. 05/06/23  Yes Loetta Rough, MD  insulin glargine (LANTUS SOLOSTAR) 100 UNIT/ML Solostar Pen Inject 15 Units into the skin 3 (three) times daily. 05/27/15   [provider]      Allergies    Patient has no known allergies.    Review of Systems   Review of Systems  Cardiovascular:  Positive for chest pain.   A 10 point review of systems was performed and is negative unless otherwise reported in HPI.  Physical Exam Updated Vital Signs BP (!) 178/76   Pulse 84   Temp 97.8 F (36.6 C) (Oral)   Resp 18   Ht 5\' 2"  (1.575 m)   Wt 62 kg   SpO2 95%   BMI 25.00 kg/m  Physical Exam General: Normal appearing female, lying in bed.  HEENT: PERRLA, Sclera anicteric, MMM,  trachea midline.  Cardiology: RRR, no murmurs/rubs/gallops. BL radial and DP pulses equal bilaterally. TTP to left lower chest without any deformities, crepitus, signs of overlying trauma.  Resp: Normal respiratory rate and effort. CTAB, no wheezes, rhonchi, crackles.  Abd: TTP to LUQ/epigastric region. Soft, non-distended. No rebound tenderness or guarding.  GU: Deferred. MSK: No peripheral edema or signs of trauma. Extremities without deformity or TTP. No cyanosis or clubbing. Skin: warm, dry.  Neuro: A&Ox4, CNs II-XII grossly intact. MAEs. Sensation grossly intact.  Psych: Normal mood and affect.   ED Results / Procedures / Treatments   Labs (all labs ordered are listed, but only abnormal results are displayed) Labs Reviewed  BASIC METABOLIC PANEL - Abnormal; Notable for the following components:      Result Value   Sodium 134 (*)    Glucose, Bld 248 (*)    All other components within normal limits  CBC  LIPASE, BLOOD  D-DIMER, QUANTITATIVE (NOT AT [facility])  TROPONIN I (HIGH SENSITIVITY)  TROPONIN I (HIGH SENSITIVITY)    EKG EKG Interpretation Date/Time:  Friday May 06 2023 16:47:29 EDT Ventricular Rate:  84 PR Interval:  132 QRS Duration:  87 QT Interval:  337 QTC Calculation: 399 R Axis:   53  Text Interpretation: Sinus rhythm Diffuse ST changes Similar to prior CT in 2022 Confirmed by Vivi Barrack [phone]) on 05/06/2023 5:16:35 PM  Radiology DG Chest 2 View  Result Date: 05/06/2023 CLINICAL DATA:  Shortness of breath EXAM: CHEST - 2 VIEW COMPARISON:  X-ray 09/22/2019 FINDINGS: Hyperinflation. Eventration of the right hemidiaphragm. No consolidation, pneumothorax or effusion. No edema. Normal cardiopericardial silhouette. Degenerative changes seen of the spine on lateral view. Air-fluid level along the stomach beneath the left hemidiaphragm. IMPRESSION: Hyperinflation.  No acute cardiopulmonary disease. Electronically Signed   By: Karen Kays M.D.   On: 05/06/2023 17:22     Procedures Procedures    Medications Ordered in ED Medications  ondansetron (ZOFRAN) injection 4 mg (4 mg Intravenous Given 05/06/23 1835)  pantoprazole (PROTONIX) injection 40 mg (40 mg Intravenous Given 05/06/23 1830)  acetaminophen (TYLENOL) tablet 1,000 mg (1,000 mg Oral Given 05/06/23 1827)  alum & mag hydroxide-simeth (MAALOX/MYLANTA) 200-200-20 MG/5ML suspension 30 mL (30 mLs Oral Given 05/06/23 1954)    And  lidocaine (XYLOCAINE) 2 % viscous mouth solution 15 mL (15 mLs Oral Given 05/06/23 1954)    ED Course/ Medical Decision Making/ A&P                          Medical Decision Making Amount and/or Complexity of Data Reviewed Labs: ordered. Decision-making details documented in ED Course. Radiology: ordered.  Risk OTC drugs. Prescription drug management.    This patient presents to the ED for concern of CP, this involves an extensive number of treatment options, and is a complaint that carries with it a high risk of complications and morbidity.  I considered the following differential and admission for this acute, potentially life threatening condition.   MDM:    DDX for chest pain includes but is not limited to:  ACS/arrhythmia,  PE, GERD/PUD/gastritis, or musculoskeletal pain. Very low suspicion for ACS given presenting sx and length of time pain has been present. EKG unchanged from prior. Consider MSK pain d/t TTP of ribs area. Consider trauma/fractures though no deformities/crepitus palpable.  Patient cannot PERC out based on age, but will obtain D dimer and reassess, minimal risk factors for PE. No c/f dissection. No abdominal pain and no c/f biliary disease. Consider GERD/gastritis/PUD, given LUQ TTP and N/V. No c/f biliary disease, esophageal rupture, PNA, or PTX.    Clinical Course as of 05/06/23 2027  Fri May 06, 2023  1716 CBC wnl [HN]  1741 Troponin I (High Sensitivity): 4 neg [HN]  1741 Basic metabolic panel(!) Unremarkable in the context of this  patient's presentation  [HN]  1855 Lipase: 48 wnl [HN]  1950 D-Dimer, Quant: <0.27 Neg [HN]  1950 Troponin I (High Sensitivity): 3 Neg [HN]  2023 Patient reevaluated.  She states she feels improved.  Her workup is very reassuring and I do not believe patient has an emergent etiology of her symptoms.  Believe she is safe to follow-up with her primary physician outpatient.  Recommended Pepcid twice per day for possible GERD/gastritis symptoms given that she improved with Protonix and GI cocktail.  Have also prescribed Zofran for nausea vomiting at home.  Also recommended PCP follow-up for hypertension and for chest pain I have referred her to cardiology as an outpatient.  Heart score is low.  Patient and her daughter both report understanding.  All questions answered to their satisfaction. DC w/ discharge instructions/return precautions. All questions answered to patient's satisfaction.   [HN]    Clinical Course User Index [HN] Loetta Rough, MD    Labs: I Ordered, and personally interpreted labs.  The pertinent results include:  those listed above  Imaging Studies ordered: I ordered imaging studies including CXR I independently visualized and interpreted imaging. I agree with the radiologist interpretation  Additional history obtained from daughter at bedside, chart review.   Cardiac Monitoring: The patient was maintained on a cardiac monitor.  I personally viewed and interpreted the cardiac monitored which showed an underlying rhythm of: NSR  Reevaluation: After the interventions noted above, I reevaluated the patient and found that they have :improved  Social Determinants of Health: Lives with her daughter  Disposition:  DC  Co morbidities that complicate the patient evaluation  Past Medical History:  Diagnosis Date   Diabetes mellitus without complication (HCC)    Hypertension      Medicines Meds ordered this encounter  Medications   ondansetron (ZOFRAN) injection 4  mg   pantoprazole (PROTONIX) injection 40 mg   acetaminophen (TYLENOL) tablet 1,000 mg   AND Linked Order Group    alum & mag hydroxide-simeth (MAALOX/MYLANTA) 200-200-20 MG/5ML suspension 30 mL    lidocaine (XYLOCAINE) 2 % viscous mouth solution 15 mL   ondansetron (ZOFRAN-ODT) 4 MG disintegrating tablet    Sig: Take 1 tablet (4 mg total) by mouth every 8 (eight) hours as needed for nausea or vomiting.    Dispense:  20 tablet    Refill:  0   famotidine (PEPCID) 20 MG tablet    Sig: Take 1 tablet (20 mg total) by mouth 2 (two) times daily.    Dispense:  30 tablet    Refill:  0    I have reviewed the patients home medicines and have made adjustments as needed  Problem List / ED Course: Problem List Items Addressed This Visit   None Visit Diagnoses     Intermittent left-sided chest pain    -  Primary   Relevant Medications   acetaminophen (TYLENOL) tablet 1,000 mg (Completed)   Other Relevant Orders   Ambulatory referral to Cardiology   Acute gastritis without hemorrhage, unspecified gastritis type                       This note was created using dictation software, which may contain spelling or grammatical errors.    Loetta Rough, MD 05/06/23 2027

## 2023-07-12 ENCOUNTER — Ambulatory Visit: Payer: Self-pay | Attending: Cardiology | Admitting: Cardiology
